# Patient Record
Sex: Female | Born: 1990 | Race: Black or African American | Hispanic: No | Marital: Single | Smoking: Current some day smoker
Health system: Southern US, Community
[De-identification: ages and names within clinical notes are randomized; demographics above are authoritative.]

## PROBLEM LIST (undated history)

## (undated) ENCOUNTER — Inpatient Hospital Stay (HOSPITAL_COMMUNITY): Payer: Self-pay

## (undated) DIAGNOSIS — D649 Anemia, unspecified: Secondary | ICD-10-CM

---
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
## 2009-05-29 ENCOUNTER — Emergency Department (HOSPITAL_COMMUNITY): Admission: EM | Admit: 2009-05-29 | Discharge: 2009-05-29 | Payer: Self-pay | Admitting: Family Medicine

## 2010-07-22 LAB — POCT PREGNANCY, URINE: Preg Test, Ur: NEGATIVE

## 2010-07-22 LAB — GC/CHLAMYDIA PROBE AMP, GENITAL: GC Probe Amp, Genital: POSITIVE — AB

## 2010-07-22 LAB — POCT URINALYSIS DIP (DEVICE)
Bilirubin Urine: NEGATIVE
Glucose, UA: NEGATIVE mg/dL
Nitrite: NEGATIVE
pH: 5.5 (ref 5.0–8.0)

## 2010-07-22 LAB — WET PREP, GENITAL: Yeast Wet Prep HPF POC: NONE SEEN

## 2012-05-30 ENCOUNTER — Emergency Department (HOSPITAL_COMMUNITY): Payer: 59

## 2012-05-30 ENCOUNTER — Emergency Department (HOSPITAL_COMMUNITY)
Admission: EM | Admit: 2012-05-30 | Discharge: 2012-05-30 | Disposition: A | Discharge status: Home / Self Care | Payer: 59 | Attending: Emergency Medicine | Admitting: Emergency Medicine

## 2012-05-30 ENCOUNTER — Encounter (HOSPITAL_COMMUNITY): Payer: Self-pay | Admitting: Emergency Medicine

## 2012-05-30 DIAGNOSIS — X500XXA Overexertion from strenuous movement or load, initial encounter: Secondary | ICD-10-CM | POA: Insufficient documentation

## 2012-05-30 DIAGNOSIS — Y9289 Other specified places as the place of occurrence of the external cause: Secondary | ICD-10-CM | POA: Insufficient documentation

## 2012-05-30 DIAGNOSIS — Y9351 Activity, roller skating (inline) and skateboarding: Secondary | ICD-10-CM | POA: Insufficient documentation

## 2012-05-30 DIAGNOSIS — S82899A Other fracture of unspecified lower leg, initial encounter for closed fracture: Secondary | ICD-10-CM | POA: Insufficient documentation

## 2012-05-30 DIAGNOSIS — R197 Diarrhea, unspecified: Secondary | ICD-10-CM | POA: Insufficient documentation

## 2012-05-30 DIAGNOSIS — R112 Nausea with vomiting, unspecified: Secondary | ICD-10-CM | POA: Insufficient documentation

## 2012-05-30 MED ORDER — HYDROCODONE-ACETAMINOPHEN 5-325 MG PO TABS: 1.0000 | ORAL_TABLET | ORAL | Status: DC | PRN

## 2012-05-30 MED ORDER — PROMETHAZINE HCL 25 MG PO TABS: 25.0000 mg | ORAL_TABLET | Freq: Four times a day (QID) | ORAL | Status: DC | PRN

## 2012-05-30 MED ORDER — IBUPROFEN 800 MG PO TABS
800.0000 mg | ORAL_TABLET | Freq: Once | ORAL | Status: AC
  Administered 2012-05-30: 800 mg via ORAL
  Filled 2012-05-30: qty 1

## 2012-05-30 MED ORDER — ONDANSETRON 4 MG PO TBDP
8.0000 mg | ORAL_TABLET | Freq: Once | ORAL | Status: AC
  Administered 2012-05-30: 8 mg via ORAL
  Filled 2012-05-30: qty 2

## 2012-05-30 MED ORDER — HYDROMORPHONE HCL 2 MG PO TABS: 2.0000 mg | ORAL_TABLET | Freq: Four times a day (QID) | ORAL | Status: DC | PRN

## 2012-05-30 NOTE — ED Notes (Addendum)
Brother Lin Landsman [phone]

## 2012-05-30 NOTE — Progress Notes (Signed)
Orthopedic Tech Progress Note Patient Details:  Cassandra Galloway Dec 14, 1990 [id number]  Ortho Devices Type of Ortho Device: CAM walker Ortho Device/Splint Location: right LE Ortho Device/Splint Interventions: Application   Tay Whitwell T 05/30/2012, 10:03 PM

## 2012-05-30 NOTE — ED Provider Notes (Signed)
History     CSN: [id number]  Arrival date & time 05/30/12  1857   First MD Initiated Contact with Patient 05/30/12 2003      Chief Complaint  Patient presents with  . Ankle Pain    (Consider location/radiation/quality/duration/timing/severity/associated sxs/prior treatment) HPI History provided by pt.   Pt was roller skating last night, skate got caught on the floor, and she inverted her right foot.  C/o severe pain across top of foot and medial/lateral ankle.  Pain aggravated by bearing weight.  Associated w/ tingling on medial aspect of foot.   History reviewed. No pertinent past medical history.  History reviewed. No pertinent past surgical history.  No family history on file.  History  Substance Use Topics  . Smoking status: Never Smoker   . Smokeless tobacco: Not on file  . Alcohol Use: Yes    OB History    Grav Para Term Preterm Abortions TAB SAB Ect Mult Living                  Review of Systems  Gastrointestinal:       N/V/D and diffuse abd discomfort since noon today  All other systems reviewed and are negative.    Allergies  Review of patient's allergies indicates no known allergies.  Home Medications  No current outpatient prescriptions on file.  BP 131/81  Pulse 98  Temp 98 F (36.7 C) (Oral)  Resp 14  SpO2 98%  LMP 05/23/2012  Physical Exam  Nursing note and vitals reviewed. Constitutional: She is oriented to person, place, and time. She appears well-developed and well-nourished. No distress.  HENT:  Head: Normocephalic and atraumatic.  Eyes:       Normal appearance  Neck: Normal range of motion.  Pulmonary/Chest: Effort normal.  Abdominal: Soft. Bowel sounds are normal. She exhibits no distension. There is no tenderness.       obese  Musculoskeletal: Normal range of motion.       Edema of right lateral malleolus.  Tenderness at and inferior to bilateral malleolus as well as dorsal surface of tarsal bones.  Pain w/ passive  dorsiflexion, lateral/medial rotation and foot inversion/eversion. 2+ DP pulse and distal sensation intact.    Neurological: She is alert and oriented to person, place, and time.  Psychiatric: She has a normal mood and affect. Her behavior is normal.    ED Course  Procedures (including critical care time)  Labs Reviewed - No data to display Dg Ankle Complete Right  05/30/2012  *RADIOLOGY REPORT*  Clinical Data: 22 year old female with right ankle injury and pain.  RIGHT ANKLE - COMPLETE 3+ VIEW  Comparison: None  Findings: An oblique fracture the distal fibula is identified with 1 mm posteriolateral displacement. There is no evidence of subluxation or dislocation. Overlying soft tissue swelling is present. No other fractures are identified.  IMPRESSION: Minimally displaced oblique fracture of the distal fibula.  No evidence of subluxation or dislocation.   Original Report Authenticated By: Harmon Pier, M.D.      1. Ankle fracture       MDM  22yo F presents w/ ankle injury.  No deformity and NV intact on exam.  Ibuprofen ordered for pain.  Xray pending.  8:14 PM   Xray shows minimally displaced fx of distal fibula.  Results discussed w/ pt.  Ortho tech placed in cam walker and pt d/c'd home w/ vicodin and referral to ortho.  Also prescribed promethazine.  Pt has had N/V/D and abd  pain since this afternoon.  Afebrile, well-hydrated and abd benign on exam.  No vomiting in ED.  Suspect viral gastroenteritis.  Return precautions discussed.  9:18 PM        Otilio Miu, PA-C 05/30/12 2128

## 2012-05-30 NOTE — ED Notes (Addendum)
PT. FELL YESTERDAY WHILE SKATING , NO LOC , REPORTS RIGHT ANKLE PAIN , ALSO REPORTS INTERMITTENT DIARRHEA /VOMITTING TODAY.

## 2012-06-01 NOTE — ED Provider Notes (Signed)
Medical screening examination/treatment/procedure(s) were performed by non-physician practitioner and as supervising physician I was immediately available for consultation/collaboration.  Alfonzia Woolum, MD 06/01/12 0043 

## 2012-11-29 ENCOUNTER — Emergency Department (HOSPITAL_COMMUNITY)
Admission: EM | Admit: 2012-11-29 | Discharge: 2012-11-29 | Disposition: A | Discharge status: Home / Self Care | Payer: 59 | Attending: Emergency Medicine | Admitting: Emergency Medicine

## 2012-11-29 ENCOUNTER — Encounter (HOSPITAL_COMMUNITY): Payer: Self-pay | Admitting: Cardiology

## 2012-11-29 DIAGNOSIS — R21 Rash and other nonspecific skin eruption: Secondary | ICD-10-CM

## 2012-11-29 DIAGNOSIS — B86 Scabies: Secondary | ICD-10-CM | POA: Insufficient documentation

## 2012-11-29 MED ORDER — PERMETHRIN 5 % EX CREA: TOPICAL_CREAM | CUTANEOUS | Status: DC

## 2012-11-29 MED ORDER — HYDROXYZINE HCL 25 MG PO TABS: 25.0000 mg | ORAL_TABLET | Freq: Four times a day (QID) | ORAL | Status: DC

## 2012-11-29 MED ORDER — PREDNISONE 20 MG PO TABS
60.0000 mg | ORAL_TABLET | Freq: Once | ORAL | Status: AC
  Administered 2012-11-29: 60 mg via ORAL
  Filled 2012-11-29: qty 3

## 2012-11-29 NOTE — ED Provider Notes (Signed)
  CSN: [id number]     Arrival date & time 11/29/12  1256 History     First MD Initiated Contact with Patient 11/29/12 1418     Chief Complaint  Patient presents with  . Rash   (Consider location/radiation/quality/duration/timing/severity/associated sxs/prior Treatment) HPI Comments: 22 year old female presents the emergency department complaining a rash on her arms and her legs beginning 2 weeks ago. Rash is very itchy. Tried applying Epsom salts, rubbing alcohol and multiple other over-the-counter treatments without relief. No new soaps, detergents, pets or medications. Denies any difficulty breathing or swallowing. States that her boyfriend was treated for possible scabies 3 weeks back, however is unsure if that is what exactly was. Boyfriend is no longer with a rash after treatment.  Patient is a 22 y.o. female presenting with rash. The history is provided by the patient.  Rash   History reviewed. No pertinent past medical history. History reviewed. No pertinent past surgical history. History reviewed. No pertinent family history. History  Substance Use Topics  . Smoking status: Never Smoker   . Smokeless tobacco: Not on file  . Alcohol Use: Yes   OB History   Grav Para Term Preterm Abortions TAB SAB Ect Mult Living                 Review of Systems  Skin: Positive for rash.  All other systems reviewed and are negative.    Allergies  Review of patient's allergies indicates no known allergies.  Home Medications   Current Outpatient Rx  Name  Route  Sig  Dispense  Refill  . diphenhydrAMINE (SOMINEX) 25 MG tablet   Oral   Take 50 mg by mouth daily as needed for itching or sleep.         . hydrOXYzine (ATARAX/VISTARIL) 25 MG tablet   Oral   Take 1 tablet (25 mg total) by mouth every 6 (six) hours.   12 tablet   0   . permethrin (ELIMITE) 5 % cream      Apply to affected area once,  Leave on for 10-12 hours and rinse off.   60 g   0    BP 113/67  Pulse 78   Temp(Src) 98.6 F (37 C) (Oral)  Resp 16  SpO2 100% Physical Exam  Nursing note and vitals reviewed. Constitutional: She is oriented to person, place, and time. She appears well-developed and well-nourished. No distress.  HENT:  Head: Normocephalic and atraumatic.  Mouth/Throat: Oropharynx is clear and moist.  No oral lesions.  Eyes: Conjunctivae are normal.  Neck: Normal range of motion. Neck supple.  Cardiovascular: Normal rate, regular rhythm and normal heart sounds.   Pulmonary/Chest: Effort normal and breath sounds normal.  Musculoskeletal: Normal range of motion. She exhibits no edema.  Neurological: She is alert and oriented to person, place, and time.  Skin: Skin is warm and dry. She is not diaphoretic.  Scattered maculopapular raised erythematous rash with excoriations, bilateral arms/leg/ankles, worse posterior thighs.  Psychiatric: She has a normal mood and affect. Her behavior is normal.    ED Course   Procedures (including critical care time)  Labs Reviewed - No data to display No results found. 1. Rash   2. Scabies     MDM  Patient with rash, consistent with scabies, pruritic. Rx permethrin cream, atarax. Prednisone 60mg  PO given in ED. Infection care/precautions discussed.  Trevor Mace, PA-C 11/29/12 1433  Trevor Mace, PA-C 11/29/12 1445

## 2012-11-29 NOTE — ED Provider Notes (Signed)
  Medical screening examination/treatment/procedure(s) were performed by non-physician practitioner and as supervising physician I was immediately available for consultation/collaboration.    Kiptyn Rafuse, MD 11/29/12 1656 

## 2012-11-29 NOTE — ED Notes (Signed)
Pt reports a rash that started a couple of weeks ago. Reports that she boyfriend was recently treated for possible scabies.

## 2012-12-05 ENCOUNTER — Encounter (HOSPITAL_COMMUNITY): Payer: Self-pay | Admitting: Emergency Medicine

## 2012-12-05 ENCOUNTER — Emergency Department (HOSPITAL_COMMUNITY)
Admission: EM | Admit: 2012-12-05 | Discharge: 2012-12-05 | Disposition: A | Discharge status: Home / Self Care | Payer: 59 | Source: Home / Self Care | Attending: Emergency Medicine | Admitting: Emergency Medicine

## 2012-12-05 DIAGNOSIS — B86 Scabies: Secondary | ICD-10-CM

## 2012-12-05 MED ORDER — METHYLPREDNISOLONE ACETATE 80 MG/ML IJ SUSP
INTRAMUSCULAR | Status: AC
  Filled 2012-12-05: qty 1

## 2012-12-05 MED ORDER — IVERMECTIN 3 MG PO TABS: ORAL_TABLET | ORAL | Status: DC

## 2012-12-05 MED ORDER — TRIAMCINOLONE ACETONIDE 0.1 % EX CREA: TOPICAL_CREAM | Freq: Three times a day (TID) | CUTANEOUS | Status: DC

## 2012-12-05 MED ORDER — METHYLPREDNISOLONE ACETATE 80 MG/ML IJ SUSP
80.0000 mg | Freq: Once | INTRAMUSCULAR | Status: AC
  Administered 2012-12-05: 80 mg via INTRAMUSCULAR

## 2012-12-05 MED ORDER — HYDROXYZINE HCL 25 MG PO TABS: 25.0000 mg | ORAL_TABLET | Freq: Four times a day (QID) | ORAL | Status: DC

## 2012-12-05 MED ORDER — PREDNISONE 10 MG PO TABS: ORAL_TABLET | ORAL | Status: DC

## 2012-12-05 NOTE — ED Notes (Signed)
C/o allergic reaction.  Patient states she used a one time medication for scabies on Tuesday, then on Wednesday she notices a rash on long her body.  Rash does itch

## 2012-12-05 NOTE — ED Provider Notes (Signed)
Chief Complaint:   Chief Complaint  Patient presents with  . Allergic Reaction    History of Present Illness:   Cassandra Galloway is a 22 year old female who has a very pruritic rash on her arms, trunk, back, and proximal thighs. This is been going on about 2-3 weeks. She's been exposed to boyfriend who has been diagnosed with scabies. He's been treated and is rash and itching are better. She went to the emergency room this past Tuesday which was 6 days ago. She was diagnosed with scabies and given permethrin cream. She apply the cream as instructed, but her rash is worse with worsened itching. She denies any difficulty breathing or swelling of the lips, tongue, or throat.  Review of Systems:  Other than noted above, the patient denies any of the following symptoms: Systemic:  No fever, chills, sweats, weight loss, or fatigue. ENT:  No nasal congestion, rhinorrhea, sore throat, swelling of lips, tongue or throat. Resp:  No cough, wheezing, or shortness of breath. Skin:  No rash, itching, nodules, or suspicious lesions.  PMFSH:  Past medical history, family history, social history, meds, and allergies were reviewed.   Physical Exam:   Vital signs:  BP 125/75  Pulse 84  Temp(Src) 97.7 F (36.5 C) (Oral)  Resp 18  SpO2 91%  LMP 11/13/2012 Gen:  Alert, oriented, in no distress. ENT:  Pharynx clear, no intraoral lesions, moist mucous membranes. Lungs:  Clear to auscultation. Skin:  There are scattered erythematous maculopapules on the trunk, under the breasts, on the back, proximal extremities, and thighs. She has a few on the right wrist, and none in the size of the fingers or in the web spaces.  Course in Urgent Care Center:   Given Depo-Medrol 80 mg IM.  Assessment:  The encounter diagnosis was Scabies.  Several possibilities exist: First of all, she could have had scabies, the permethrin at work, but she still has itching due to persistence of the dead scabies under the skin, second she  could have had scabies, the permethrin work, but she had an allergic reaction to permethrin, third is possible of the permethrin didn't do any good and she still has scabies, for this could be something else entirely. I'm going to treat with ivermectin pills as well as prednisone by mouth, injection, and cream. If this fails to help, she'll need to see a dermatologist.  Plan:   1.  The following meds were prescribed:   Discharge Medication List as of 12/05/2012  5:29 PM    START taking these medications   Details  !! hydrOXYzine (ATARAX/VISTARIL) 25 MG tablet Take 1 tablet (25 mg total) by mouth every 6 (six) hours., Starting 12/05/2012, Until Discontinued, Normal    ivermectin (STROMECTOL) 3 MG TABS Take 6 tabs by mouth once., Normal    predniSONE (DELTASONE) 10 MG tablet Take 4 tabs daily for 4 days, 3 tabs daily for 4 days, 2 tabs daily for 4 days, then 1 tab daily for 4 days., Normal    triamcinolone cream (KENALOG) 0.1 % Apply topically 3 (three) times daily., Starting 12/05/2012, Until Discontinued, Normal     !! - Potential duplicate medications found. Please discuss with provider.     2.  The patient was instructed in symptomatic care and handouts were given. 3.  The patient was told to return if becoming worse in any way, if no better in 3 or 4 days, and given some red flag symptoms such as worsening rash that would indicate earlier  return. 4.  Follow up with Dr. Para Skeans if no better in a week.     Reuben Likes, MD 12/05/12 2052

## 2013-08-26 ENCOUNTER — Encounter (HOSPITAL_COMMUNITY): Payer: Self-pay | Admitting: Emergency Medicine

## 2013-08-26 ENCOUNTER — Emergency Department (INDEPENDENT_AMBULATORY_CARE_PROVIDER_SITE_OTHER)
Admission: EM | Admit: 2013-08-26 | Discharge: 2013-08-26 | Disposition: A | Discharge status: Home / Self Care | Payer: Self-pay | Source: Home / Self Care | Attending: Emergency Medicine | Admitting: Emergency Medicine

## 2013-08-26 ENCOUNTER — Other Ambulatory Visit (HOSPITAL_COMMUNITY)
Admission: RE | Admit: 2013-08-26 | Discharge: 2013-08-26 | Disposition: A | Discharge status: Home / Self Care | Payer: Self-pay | Source: Ambulatory Visit | Attending: Emergency Medicine | Admitting: Emergency Medicine

## 2013-08-26 DIAGNOSIS — N898 Other specified noninflammatory disorders of vagina: Secondary | ICD-10-CM

## 2013-08-26 DIAGNOSIS — N760 Acute vaginitis: Secondary | ICD-10-CM | POA: Insufficient documentation

## 2013-08-26 DIAGNOSIS — Z113 Encounter for screening for infections with a predominantly sexual mode of transmission: Secondary | ICD-10-CM | POA: Insufficient documentation

## 2013-08-26 DIAGNOSIS — Z202 Contact with and (suspected) exposure to infections with a predominantly sexual mode of transmission: Secondary | ICD-10-CM

## 2013-08-26 DIAGNOSIS — N390 Urinary tract infection, site not specified: Secondary | ICD-10-CM

## 2013-08-26 LAB — POCT URINALYSIS DIP (DEVICE)
BILIRUBIN URINE: NEGATIVE
Glucose, UA: NEGATIVE mg/dL
Hgb urine dipstick: NEGATIVE
Ketones, ur: NEGATIVE mg/dL
Nitrite: NEGATIVE
PH: 7 (ref 5.0–8.0)
PROTEIN: NEGATIVE mg/dL
SPECIFIC GRAVITY, URINE: 1.025 (ref 1.005–1.030)
Urobilinogen, UA: 1 mg/dL (ref 0.0–1.0)

## 2013-08-26 LAB — POCT PREGNANCY, URINE: PREG TEST UR: NEGATIVE

## 2013-08-26 MED ORDER — CEFTRIAXONE SODIUM 250 MG IJ SOLR
INTRAMUSCULAR | Status: AC
  Filled 2013-08-26: qty 250

## 2013-08-26 MED ORDER — NITROFURANTOIN MONOHYD MACRO 100 MG PO CAPS: 100.0000 mg | ORAL_CAPSULE | Freq: Two times a day (BID) | ORAL | Status: DC

## 2013-08-26 MED ORDER — CEFTRIAXONE SODIUM 250 MG IJ SOLR
250.0000 mg | Freq: Once | INTRAMUSCULAR | Status: AC
  Administered 2013-08-26: 250 mg via INTRAMUSCULAR

## 2013-08-26 MED ORDER — AZITHROMYCIN 250 MG PO TABS
1000.0000 mg | ORAL_TABLET | Freq: Once | ORAL | Status: AC
  Administered 2013-08-26: 1000 mg via ORAL

## 2013-08-26 MED ORDER — AZITHROMYCIN 250 MG PO TABS
ORAL_TABLET | ORAL | Status: AC
  Filled 2013-08-26: qty 4

## 2013-08-26 MED ORDER — LIDOCAINE HCL (PF) 1 % IJ SOLN
INTRAMUSCULAR | Status: AC
  Filled 2013-08-26: qty 5

## 2013-08-26 NOTE — ED Provider Notes (Signed)
CSN: [id number]     Arrival date & time 08/26/13  1638 History   First MD Initiated Contact with Patient 08/26/13 1847     Chief Complaint  Patient presents with  . Vaginal Discharge    concerns for stds  . Dysuria    Patient is a 23 y.o. female presenting with vaginal discharge and dysuria. The history is provided by the patient.  Vaginal Discharge Quality:  Yellow and green Severity:  Moderate Onset quality:  Gradual Duration:  1 week Timing:  Constant Progression:  Worsening Chronicity:  New Relieved by:  None tried Ineffective treatments:  OTC medications Associated symptoms: abdominal pain and dysuria   Abdominal pain:    Location:  Suprapubic   Quality:  Cramping   Severity:  Mild   Onset quality:  Sudden   Duration:  1 day   Timing:  Intermittent   Progression:  Unchanged   Chronicity:  New Risk factors: STI and unprotected sex   Risk factors: no endometriosis, no foreign body, no gynecological surgery, no immunosuppression, no new sexual partner, no PID, no prior miscarriage, no STI exposure and no terminated pregnancy   Dysuria Pain quality:  Burning Pain severity:  Mild Onset quality:  Gradual Duration:  1 day Timing:  Constant Progression:  Unchanged Chronicity:  New Recent urinary tract infections: yes   Relieved by:  None tried Worsened by:  Nothing tried Associated symptoms: abdominal pain and vaginal discharge   Risk factors: sexually active and sexually transmitted infections   Risk factors: no hx of pyelonephritis, no hx of urolithiasis, no kidney transplant, not pregnant, no recurrent urinary tract infections, no renal cysts, no renal disease, not single kidney and no urinary catheter   Pt is G0, on Mirena IUD for contraception w/ h/o Trichomonas. Admits to being sexually active w/ one partner (occasional use of condoms). Last PAP and annual screening 10/2012.  History reviewed. No pertinent past medical history. History reviewed. No pertinent past  surgical history. History reviewed. No pertinent family history. History  Substance Use Topics  . Smoking status: Never Smoker   . Smokeless tobacco: Not on file  . Alcohol Use: Yes   OB History   Grav Para Term Preterm Abortions TAB SAB Ect Mult Living                 Review of Systems  Gastrointestinal: Positive for abdominal pain.  Genitourinary: Positive for dysuria and vaginal discharge.  All other systems reviewed and are negative.   Allergies  Review of patient's allergies indicates no known allergies.  Home Medications   Prior to Admission medications   Medication Sig Start Date End Date Taking? Authorizing Provider  diphenhydrAMINE (SOMINEX) 25 MG tablet Take 50 mg by mouth daily as needed for itching or sleep.    Historical Provider, MD  hydrOXYzine (ATARAX/VISTARIL) 25 MG tablet Take 1 tablet (25 mg total) by mouth every 6 (six) hours. 11/29/12   Trevor Maceobyn M Albert, PA-C  hydrOXYzine (ATARAX/VISTARIL) 25 MG tablet Take 1 tablet (25 mg total) by mouth every 6 (six) hours. 12/05/12   Reuben Likesavid C Keller, MD  ivermectin (STROMECTOL) 3 MG TABS Take 6 tabs by mouth once. 12/05/12   Reuben Likesavid C Keller, MD  permethrin (ELIMITE) 5 % cream Apply to affected area once,  Leave on for 10-12 hours and rinse off. 11/29/12   Trevor Maceobyn M Albert, PA-C  predniSONE (DELTASONE) 10 MG tablet Take 4 tabs daily for 4 days, 3 tabs daily for 4 days, 2 tabs  daily for 4 days, then 1 tab daily for 4 days. 12/05/12   Reuben Likesavid C Keller, MD  triamcinolone cream (KENALOG) 0.1 % Apply topically 3 (three) times daily. 12/05/12   Reuben Likesavid C Keller, MD   BP 130/85  Pulse 80  Temp(Src) 99.3 F (37.4 C) (Oral)  Resp 20  SpO2 100% Physical Exam  Constitutional: She is oriented to person, place, and time. She appears well-developed and well-nourished.  HENT:  Head: Normocephalic and atraumatic.  Eyes: Conjunctivae are normal.  Pulmonary/Chest: Effort normal.  Abdominal: Soft. She exhibits no distension and no mass. There is no  tenderness. There is no rebound and no guarding. Hernia confirmed negative in the right inguinal area and confirmed negative in the left inguinal area.  Genitourinary: Uterus normal. Pelvic exam was performed with patient supine. No labial fusion. There is no rash, tenderness, lesion or injury on the right labia. There is no rash, tenderness, lesion or injury on the left labia. Cervix exhibits motion tenderness, discharge and friability. Right adnexum displays no mass, no tenderness and no fullness. Left adnexum displays no mass, no tenderness and no fullness. No erythema, tenderness or bleeding around the vagina. No foreign body around the vagina. No signs of injury around the vagina. Vaginal discharge found.  Copius thick,  light greenish-yellow d/c   Lymphadenopathy:       Right: No inguinal adenopathy present.       Left: No inguinal adenopathy present.  Neurological: She is alert and oriented to person, place, and time.  Skin: Skin is warm and dry.  Psychiatric: She has a normal mood and affect.    ED Course  Pelvic exam Date/Time: 08/26/2013 7:42 PM Performed by: Leanne ChangSCHORR, Nevea Spiewak P Authorized by: Leslee HomeKELLER, DAVID C Consent: Verbal consent obtained. Risks and benefits: risks, benefits and alternatives were discussed Consent given by: patient Patient understanding: patient states understanding of the procedure being performed Required items: required blood products, implants, devices, and special equipment available Patient identity confirmed: verbally with patient and arm band Preparation: Patient was prepped and draped in the usual sterile fashion. Local anesthesia used: no Patient sedated: no Patient tolerance: Patient tolerated the procedure well with no immediate complications.   (including critical care time) Labs Review Labs Reviewed  POCT URINALYSIS DIP (DEVICE) - Abnormal; Notable for the following:    Leukocytes, UA LARGE (*)    All other components within normal limits   HIV ANTIBODY (ROUTINE TESTING)  POCT PREGNANCY, URINE  CERVICOVAGINAL ANCILLARY ONLY    Imaging Review No results found.   MDM   1. Vaginal discharge   2. Possible exposure to STD   3. UTI (urinary tract infection)    Vag d/c x 1 week w/ dysuria x 1 day associated w/ mild intermittent lower abd cramping. No fever. Pelvic remarkable for copius greenish-yellow, thick vag d/c w/ + CMT and Friability. U/A +. Will treat w/ Rocephin and Zithromax today as well as Macrobid x 7 days for symptomatic + u/a. Pt agreeable to HIV screening. Will call Monday for results of remaining tests. [facility] screening and assessment and management of Mirena IUD. Safe sex and STD precautions discussed and provided in print. Pt verbalizes understanding and is agreeable w/ plan.    Leanne ChangKatherine P Risa Auman, NP 08/26/13 2038

## 2013-08-26 NOTE — ED Notes (Signed)
C/o  Dysuria.  Hematuria.  Thick yellow vaginal discharge.  Mild odor.  Symptoms from 4/14 No otc treatments tried

## 2013-08-26 NOTE — Discharge Instructions (Signed)
Call Monday to get your culture results. If Bacterial Vaginosis, Yeast or Trichomonas return positive we will call your treatment into your pharmacy. Do not have intercourse for 10 days. When you resume intercourse use condoms. Take the antibiotic as directed for your urinary tract infection and drink plenty of liquids, preferably water. Arrange follow up at the [facility] or [facility] for your annual screening and assessment & management of your Mirena IUD.   Urinary Tract Infection Urinary tract infections (UTIs) can develop anywhere along your urinary tract. Your urinary tract is your body's drainage system for removing wastes and extra water. Your urinary tract includes two kidneys, two ureters, a bladder, and a urethra. Your kidneys are a pair of bean-shaped organs. Each kidney is about the size of your fist. They are located below your ribs, one on each side of your spine. CAUSES Infections are caused by microbes, which are microscopic organisms, including fungi, viruses, and bacteria. These organisms are so small that they can only be seen through a microscope. Bacteria are the microbes that most commonly cause UTIs. SYMPTOMS  Symptoms of UTIs may vary by age and gender of the patient and by the location of the infection. Symptoms in young women typically include a frequent and intense urge to urinate and a painful, burning feeling in the bladder or urethra during urination. Older women and men are more likely to be tired, shaky, and weak and have muscle aches and abdominal pain. A fever may mean the infection is in your kidneys. Other symptoms of a kidney infection include pain in your back or sides below the ribs, nausea, and vomiting. DIAGNOSIS To diagnose a UTI, your caregiver will ask you about your symptoms. Your caregiver also will ask to provide a urine sample. The urine sample will be tested for bacteria and white blood cells. White blood cells are made by  your body to help fight infection. TREATMENT  Typically, UTIs can be treated with medication. Because most UTIs are caused by a bacterial infection, they usually can be treated with the use of antibiotics. The choice of antibiotic and length of treatment depend on your symptoms and the type of bacteria causing your infection. HOME CARE INSTRUCTIONS  If you were prescribed antibiotics, take them exactly as your caregiver instructs you. Finish the medication even if you feel better after you have only taken some of the medication.  Drink enough water and fluids to keep your urine clear or pale yellow.  Avoid caffeine, tea, and carbonated beverages. They tend to irritate your bladder.  Empty your bladder often. Avoid holding urine for long periods of time.  Empty your bladder before and after sexual intercourse.  After a bowel movement, women should cleanse from front to back. Use each tissue only once. SEEK MEDICAL CARE IF:   You have back pain.  You develop a fever.  Your symptoms do not begin to resolve within 3 days. SEEK IMMEDIATE MEDICAL CARE IF:   You have severe back pain or lower abdominal pain.  You develop chills.  You have nausea or vomiting.  You have continued burning or discomfort with urination. MAKE SURE YOU:   Understand these instructions.  Will watch your condition.  Will get help right away if you are not doing well or get worse. Document Released: 01/29/2005 Document Revised: 10/21/2011 Document Reviewed: 05/30/2011 [facility] Patient Information 2014 [place], [place].  Sexually Transmitted Disease A sexually transmitted disease (STD) is a disease or infection. It  may be passed from person to person. It usually is passed during sex. STDs can be spread by different types of germs. These germs are bacteria, viruses, and parasites. An STD can be passed through:  Spit (saliva).  Semen.  Blood.  Mucus from the vagina.  Pee (urine). HOW CAN I LESSEN MY  CHANCES OF GETTING AN STD?  Only use condoms labeled "latex," dental dams, and lubricants that wash away with water (water soluble). Do not use petroleum jelly or oils.  Get shots (vaccines) for HPV and hepatitis.  Avoid risky sex behavior that can break the skin. WHAT SHOULD I DO IF I THINK I HAVE AN STD?  See your doctor.  Tell your sex partner(s) that you have an STD. They should be tested and treated.  Do not have sex until your doctor says it is OK. WHEN SHOULD I GET HELP? Get help if:  You have bad belly (abdominal) pain.  You are a man and have puffiness (swelling) or pain in your testicles.  You are a woman and have puffiness in your vagina. MAKE SURE YOU:  Understand these instructions. Document Released: 05/29/2004 Document Revised: 02/09/2013 Document Reviewed: 10/15/2012 [facility] Patient Information 2014 [place], [place].

## 2013-08-26 NOTE — ED Notes (Signed)
Pt given injection will discharge at 8:15 p.m.   Mw,cma

## 2013-08-27 LAB — HIV ANTIBODY (ROUTINE TESTING W REFLEX): HIV 1&2 Ab, 4th Generation: NONREACTIVE

## 2013-08-27 NOTE — ED Provider Notes (Signed)
Medical screening examination/treatment/procedure(s) were performed by non-physician practitioner and as supervising physician I was immediately available for consultation/collaboration.  Leslee Homeavid Belal Scallon, M.D.  Reuben Likesavid C Melisia Leming, MD 08/27/13 1440

## 2013-08-29 LAB — CERVICOVAGINAL ANCILLARY ONLY
Chlamydia: NEGATIVE
Neisseria Gonorrhea: NEGATIVE
Wet Prep (BD Affirm): NEGATIVE
Wet Prep (BD Affirm): NEGATIVE
Wet Prep (BD Affirm): POSITIVE — AB

## 2013-08-31 ENCOUNTER — Telehealth (HOSPITAL_COMMUNITY): Payer: Self-pay | Admitting: Emergency Medicine

## 2013-08-31 ENCOUNTER — Telehealth (HOSPITAL_COMMUNITY): Payer: Self-pay | Admitting: *Deleted

## 2013-08-31 MED ORDER — METRONIDAZOLE 500 MG PO TABS: ORAL_TABLET | ORAL | Status: DC

## 2013-08-31 NOTE — ED Notes (Signed)
I called pt. Pt. verified x 2 and given results.  Pt. told she needs Flagyl for Trich. Pt. states she already picked up the Rx.   Pt. instructed to no alcohol while taking this medication.  Pt. instructed to notify her partner to be treated with Flagyl, no sex until she has finished her medication and her partner has been treated and to practice safe sex. You can get HIV rechecked in 6 mos. at [facility], by appointment. Pt. states her partner came the same day and was tested. I told her to have him call me and I would check his results and as the doctor about treatment. Desiree LucySuzanne M [facility] 08/31/2013

## 2013-08-31 NOTE — Telephone Encounter (Signed)
Message copied by Reuben LikesKELLER, Michel Hendon C on Wed Aug 31, 2013  1:57 PM ------      Message from: Vassie MoselleYORK, SUZANNE M      Created: Tue Aug 30, 2013 11:21 PM      Regarding: labs       Trich pos. Needs treatment.      Desiree LucySuzanne M [place]      08/30/2013       ------

## 2013-08-31 NOTE — ED Notes (Signed)
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  She will need Flagyl 500 mg #4 all at one time.  Will send to her pharmacy, [facility] on [address].  We will call her and inform her of this result and need to inform her sexual partners of their need to get treated as well.  Reuben Likesavid C Holdan Stucke, MD 08/31/13 [phone]

## 2013-09-02 NOTE — ED Notes (Signed)
Pt. called on VM with questions.  I called pt. back.  She said she took her pills for Trich @ 2230 on Wed. and is still having a slight light yellow discharge this AM.  She said she only got the 4 pills and her boyfriend got 2 BID # 14. I explained it was just a difference in the providers orders. She got adequate treatment according to the CDC guidelines.  Discussed with Dr. Lorenz CoasterKeller and he agreed and said she may have had some bacterial vaginosis. I checked her lab results and she only had the Trich.  I told her it may not have been enough time to clear the infection. I told her if symptoms persist by next Wednesday to come back and get rechecked. Pt. agreed with this plan. Cassandra Galloway 09/02/2013

## 2014-06-19 IMAGING — CR DG ANKLE COMPLETE 3+V*R*
3 series · 3 of 3 positions shown · non-contrast
Comparison: None

CLINICAL DATA: 22 year old female with right ankle injury and pain.

RIGHT ANKLE - COMPLETE 3+ VIEW

[x ankle ap right]
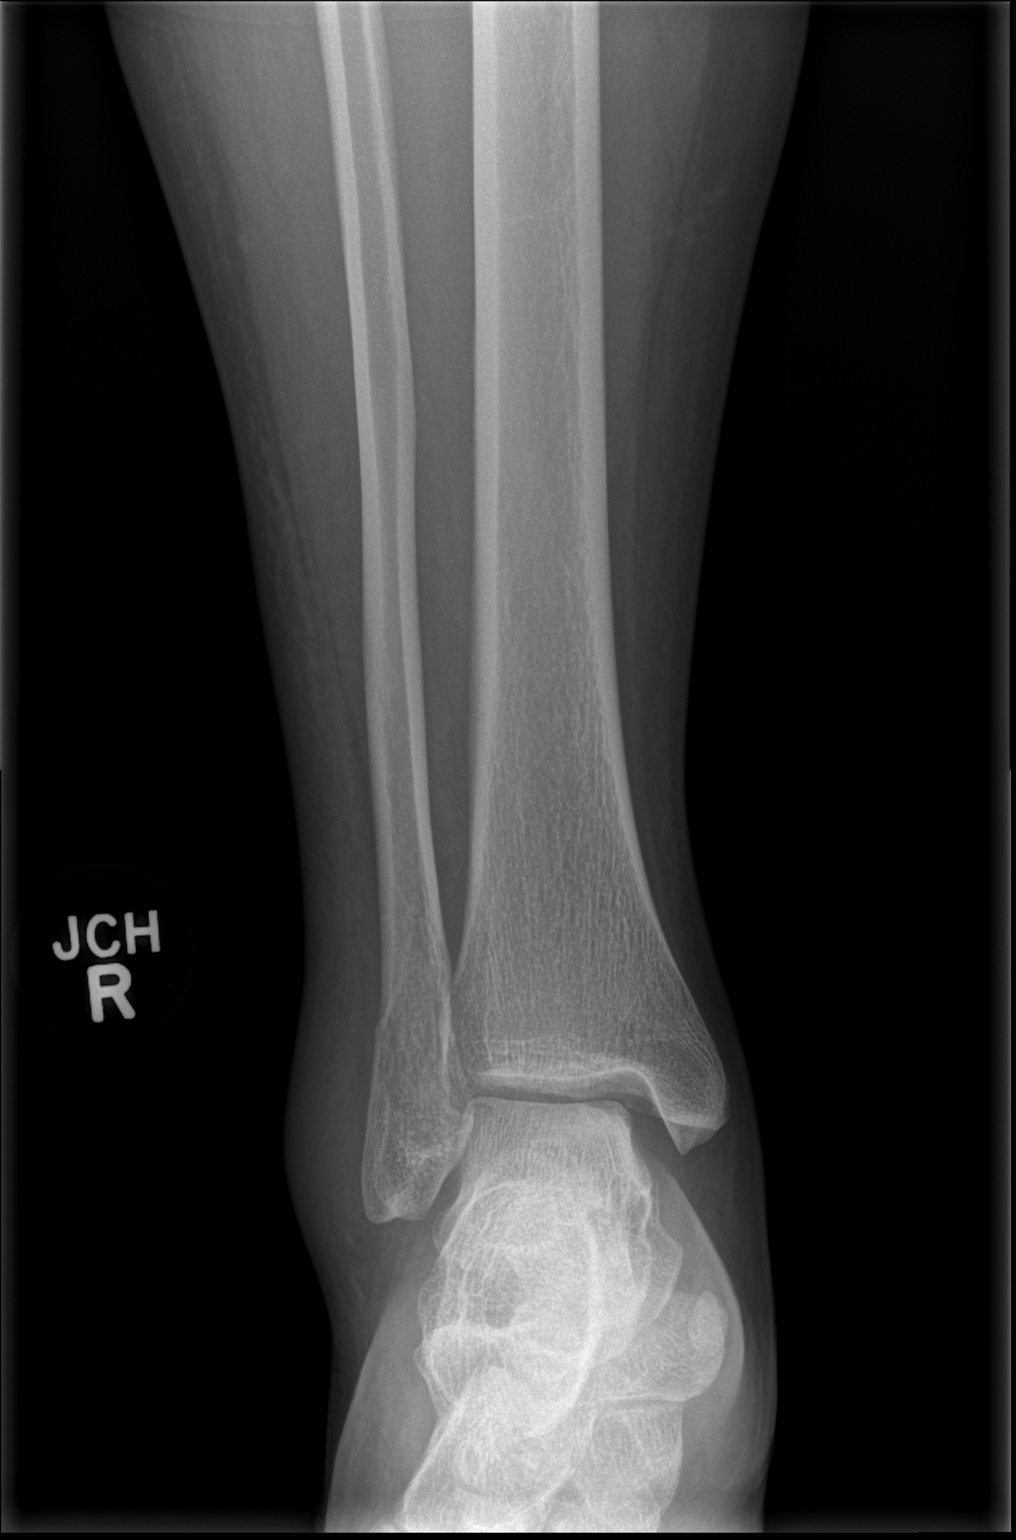

[x ankle obl right]
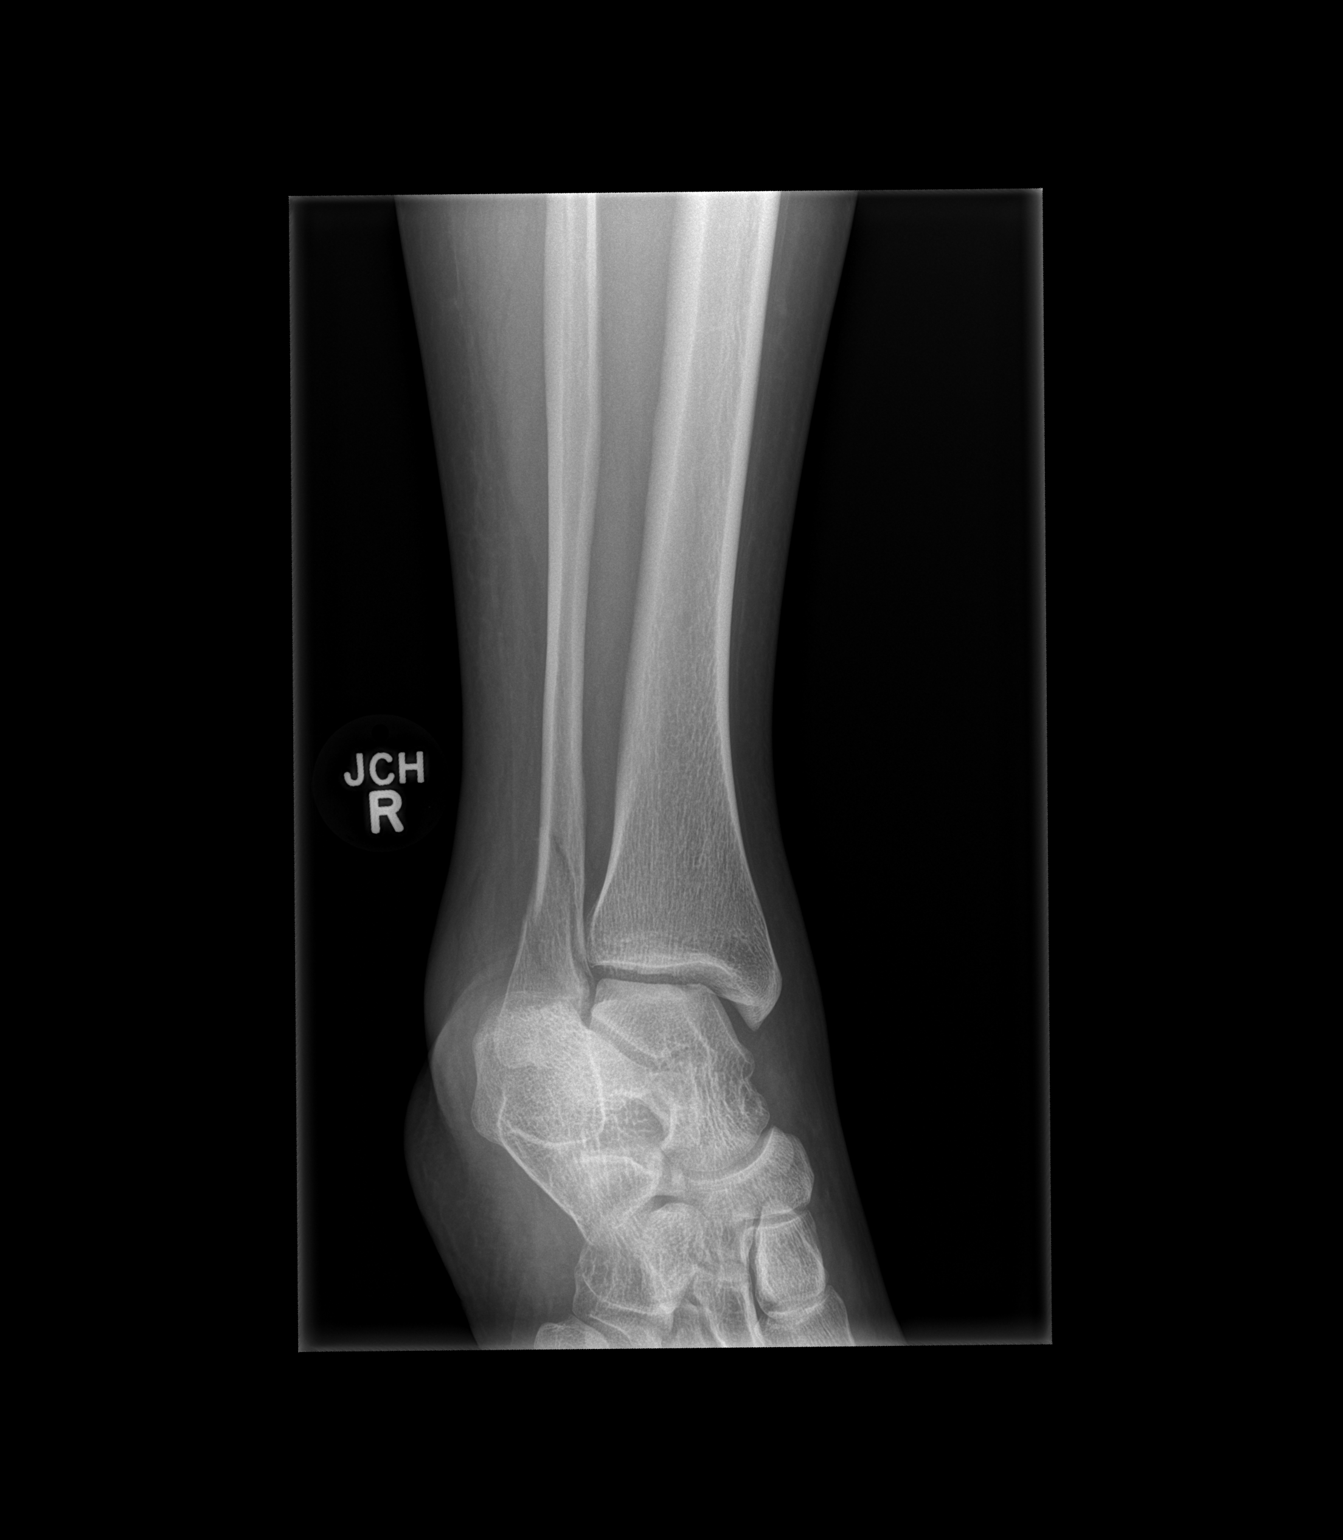

[x ankle lat right]
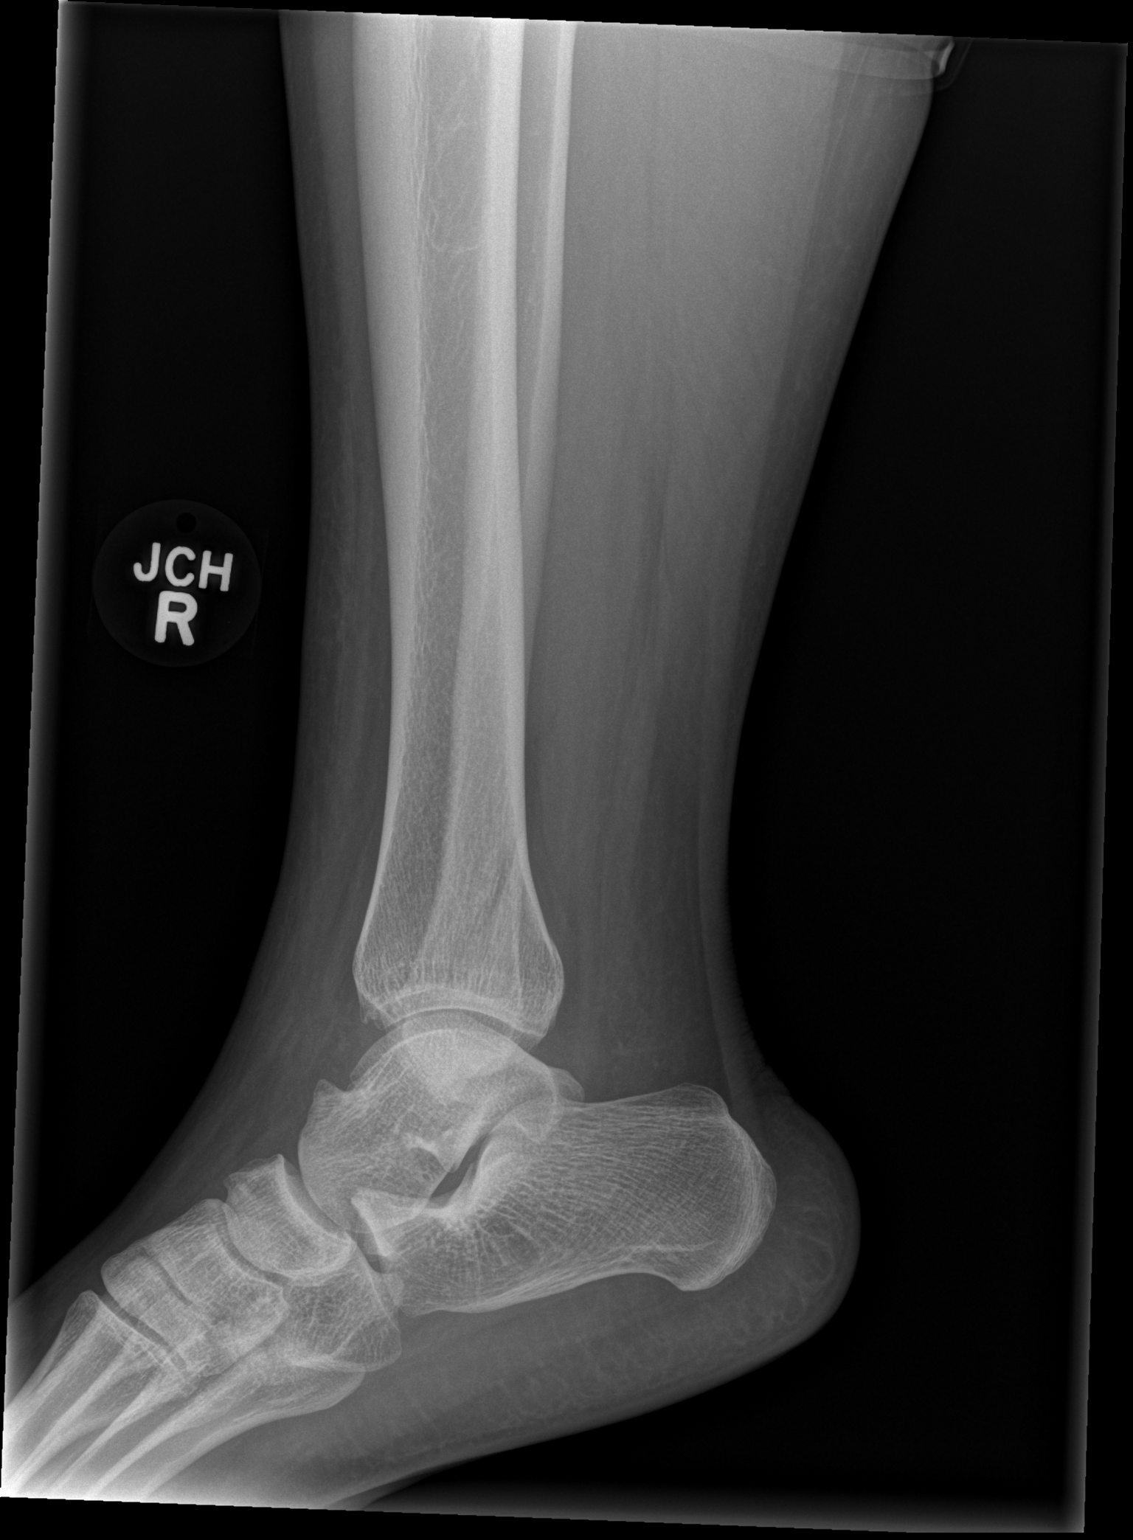

[3 of 3 positions shown; findings below may reference images not displayed]

FINDINGS: An oblique fracture the distal fibula is identified with
1 mm posteriolateral displacement.
There is no evidence of subluxation or dislocation.
Overlying soft tissue swelling is present.
No other fractures are identified.
IMPRESSION: Minimally displaced oblique fracture of the distal fibula.  No
evidence of subluxation or dislocation.

## 2017-11-19 ENCOUNTER — Encounter (HOSPITAL_COMMUNITY): Payer: Self-pay

## 2017-11-19 ENCOUNTER — Ambulatory Visit (HOSPITAL_COMMUNITY)
Admission: EM | Admit: 2017-11-19 | Discharge: 2017-11-19 | Disposition: A | Discharge status: Home / Self Care | Payer: Self-pay | Attending: Family | Admitting: Family

## 2017-11-19 DIAGNOSIS — N898 Other specified noninflammatory disorders of vagina: Secondary | ICD-10-CM

## 2017-11-19 DIAGNOSIS — N9089 Other specified noninflammatory disorders of vulva and perineum: Secondary | ICD-10-CM

## 2017-11-19 DIAGNOSIS — N9489 Other specified conditions associated with female genital organs and menstrual cycle: Secondary | ICD-10-CM

## 2017-11-19 MED ORDER — FLUCONAZOLE 200 MG PO TABS: 200.0000 mg | ORAL_TABLET | Freq: Every day | ORAL | 0 refills | Status: AC

## 2017-11-19 MED ORDER — METHYLPREDNISOLONE ACETATE 80 MG/ML IJ SUSP
80.0000 mg | Freq: Once | INTRAMUSCULAR | Status: AC
  Administered 2017-11-19: 80 mg via INTRAMUSCULAR

## 2017-11-19 MED ORDER — METHYLPREDNISOLONE ACETATE 80 MG/ML IJ SUSP
INTRAMUSCULAR | Status: AC
  Filled 2017-11-19: qty 1

## 2017-11-19 NOTE — ED Notes (Signed)
At bedside with patient and provider during exam

## 2017-11-19 NOTE — Discharge Instructions (Signed)
You were given a steroid shot today to help with irritation, itching and swelling. Recommend start Diflucan 200mg  once daily for 5 days. Recommend cool compresses to area for comfort. Do not apply any topical medication. Just wash with water. Follow-up in 2 to 3 days if not improving.

## 2017-11-19 NOTE — ED Triage Notes (Signed)
Pt Presents with extreme vaginal irritation and possibly allergic reaction to vaginal cream

## 2017-11-20 NOTE — ED Provider Notes (Signed)
[facility]    CSN: [id number] Arrival date & time: 11/19/17  1131     History   Chief Complaint Chief Complaint  Patient presents with  . Vaginal Irritation    HPI Cassandra Galloway is a 27 y.o. female.   27 year old female presents with vaginal irritation and labial swelling. She first experienced some vaginal itching and slightly clumpy white discharge 3 days ago. Last night she tried to treat the probable yeast infection with OTC Tioconazole suppository (one dose). During the night she started experiencing vaginal irritation and pain as well as swelling. She was unable to sleep due to the discomfort. She is concerned she may be having an allergic reaction to the OTC anti-fungal. She took oral Benadryl 2 hours ago which has helped some with the itching. She also has applied an ice pack to the area. She denies any fever, abdominal, pelvic or back pain. She also denies any distinct dysuria, unusual vaginal bleeding, nausea, vomiting or diarrhea. She has a history of recurrent BV in which her GYN has her on a monthly routine of taking 4 tablets of Flagyl for one day after her period ends. She also takes 1 dose of Diflucan due to the Flagyl usually giving her a yeast infection. Her last dose was about 3 weeks ago. Her last period was 10/26/17 and is due in about 1 week. She has not been recently sexually active. Otherwise no chronic health issues. Takes no daily medication.   The history is provided by the patient.    History reviewed. No pertinent past medical history.  There are no active problems to display for this patient.   History reviewed. No pertinent surgical history.  OB History   None      Home Medications    Prior to Admission medications   Medication Sig Start Date End Date Taking? Authorizing Provider  diphenhydrAMINE (SOMINEX) 25 MG tablet Take 50 mg by mouth daily as needed for itching or sleep.    [provider]  fluconazole  (DIFLUCAN) 200 MG tablet Take 1 tablet (200 mg total) by mouth daily for 5 days. 11/19/17 11/24/17  Sudie Grumbling, NP  metroNIDAZOLE (FLAGYL) 500 MG tablet Take all 4 tablets at 1 time. 08/31/13   Reuben Likes, MD    Family History History reviewed. No pertinent family history.  Social History Social History   Tobacco Use  . Smoking status: Never Smoker  Substance Use Topics  . Alcohol use: Yes  . Drug use: No     Allergies   Patient has no known allergies.   Review of Systems Review of Systems  Constitutional: Negative for activity change, appetite change, chills, fatigue and fever.  HENT: Negative for mouth sores, sore throat and trouble swallowing.   Respiratory: Negative for cough, chest tightness, shortness of breath and wheezing.   Cardiovascular: Negative for chest pain and palpitations.  Gastrointestinal: Negative for abdominal pain, diarrhea, nausea and vomiting.  Genitourinary: Positive for vaginal discharge and vaginal pain. Negative for decreased urine volume, difficulty urinating, dysuria, flank pain, frequency, genital sores, hematuria, pelvic pain, urgency and vaginal bleeding.  Musculoskeletal: Negative for arthralgias, back pain and myalgias.  Skin: Positive for rash. Negative for color change and wound.  Allergic/Immunologic: Negative for immunocompromised state.  Neurological: Negative for dizziness, seizures, syncope, weakness, light-headedness, numbness and headaches.  Hematological: Negative for adenopathy. Does not bruise/bleed easily.  Psychiatric/Behavioral: Negative.      Physical Exam Triage Vital Signs ED  Triage Vitals [11/19/17 1205]  Enc Vitals Group     BP 112/66     Pulse Rate 72     Resp 19     Temp 98.9 F (37.2 C)     Temp Source Temporal     SpO2 96 %     Weight      Height      Head Circumference      Peak Flow      Pain Score      Pain Loc      Pain Edu?      Excl. in GC?    No data found.  Updated Vital Signs BP  112/66 (BP Location: Left Arm)   Pulse 72   Temp 98.9 F (37.2 C) (Temporal)   Resp 19   LMP 10/26/2017   SpO2 96%   Visual Acuity Right Eye Distance:   Left Eye Distance:   Bilateral Distance:    Right Eye Near:   Left Eye Near:    Bilateral Near:     Physical Exam  Constitutional: She is oriented to person, place, and time. Vital signs are normal. She appears well-developed and well-nourished. She is cooperative. She does not appear ill. No distress.  She is sitting on the exam table in no acute distress but appears uncomfortable sitting.   HENT:  Head: Normocephalic and atraumatic.  Mouth/Throat: Oropharynx is clear and moist.  Eyes: Conjunctivae and EOM are normal.  Neck: Normal range of motion. Neck supple.  Cardiovascular: Normal rate, regular rhythm and normal heart sounds.  No murmur heard. Pulmonary/Chest: Effort normal and breath sounds normal. No respiratory distress. She has no decreased breath sounds. She has no wheezes. She has no rhonchi. She has no rales.  Abdominal: Soft. Normal appearance and bowel sounds are normal. There is no tenderness. There is no rigidity, no rebound, no guarding and no CVA tenderness. Hernia confirmed negative in the right inguinal area and confirmed negative in the left inguinal area.  Genitourinary:    Pelvic exam was performed with patient in the knee-chest position. There is rash and tenderness on the right labia. There is no lesion or injury on the right labia. There is rash and tenderness on the left labia. There is no lesion or injury on the left labia. Vaginal discharge found.  Genitourinary Comments: Very red and swollen labia- more on right than left side. White vaginal discharge present at vaginal opening. Very tender. No distinct lesions or ulcers seen. Did not perform internal exam due to discomfort.   Musculoskeletal: Normal range of motion.  Lymphadenopathy:    She has no cervical adenopathy. No inguinal adenopathy noted on  the right or left side.  Neurological: She is alert and oriented to person, place, and time.  Skin: Skin is warm and dry.  Psychiatric: She has a normal mood and affect. Her behavior is normal. Judgment and thought content normal.  Vitals reviewed.    UC Treatments / Results  Labs (all labs ordered are listed, but only abnormal results are displayed) Labs Reviewed - No data to display  EKG None  Radiology No results found.  Procedures Procedures (including critical care time)  Medications Ordered in UC Medications  methylPREDNISolone acetate (DEPO-MEDROL) injection 80 mg (80 mg Intramuscular Given 11/19/17 1257)    Initial Impression / Assessment and Plan / UC Course  I have reviewed the triage vital signs and the nursing notes.  Pertinent labs & imaging results that were available during my care  of the patient were reviewed by me and considered in my medical decision making (see chart for details).    Discussed clinical findings with patient. She may be having an allergic skin reaction to the OTC anti-fungal suppository. Gave DepoMedrol 80mg  IM now to help with swelling and itching. Discussed that her symptoms prior to using the anti-fungal appear to be a yeast infection. Will treat with Diflucan 200mg  once daily for 5 days. Encouraged to continue Benadryl 25mg  every 6 hours as needed for itching. May continue cold compresses to area for comfort. Discussed that no herpetic lesions seen externally but may consider testing for HSV if symptoms persist in the next 2 to 3 days. Recommend not applying any topical medication to vaginal area. May wash with water. No soap. Follow-up in 2 to 3 days if not improving.  Final Clinical Impressions(s) / UC Diagnoses   Final diagnoses:  Vaginal irritation  Labial swelling     Discharge Instructions     You were given a steroid shot today to help with irritation, itching and swelling. Recommend start Diflucan 200mg  once daily for 5 days.  Recommend cool compresses to area for comfort. Do not apply any topical medication. Just wash with water. Follow-up in 2 to 3 days if not improving.     ED Prescriptions    Medication Sig Dispense Auth. Provider   fluconazole (DIFLUCAN) 200 MG tablet Take 1 tablet (200 mg total) by mouth daily for 5 days. 5 tablet Sudie Grumbling, NP     Controlled Substance Prescriptions [place] Controlled Substance Registry consulted? Not Applicable   Sudie Grumbling, NP 11/20/17 1128

## 2017-12-04 ENCOUNTER — Encounter (HOSPITAL_COMMUNITY): Payer: Self-pay

## 2017-12-04 ENCOUNTER — Ambulatory Visit (HOSPITAL_COMMUNITY)
Admission: EM | Admit: 2017-12-04 | Discharge: 2017-12-04 | Disposition: A | Discharge status: Home / Self Care | Payer: Self-pay | Attending: Internal Medicine | Admitting: Internal Medicine

## 2017-12-04 DIAGNOSIS — M5442 Lumbago with sciatica, left side: Secondary | ICD-10-CM

## 2017-12-04 MED ORDER — PREDNISONE 10 MG (21) PO TBPK: ORAL_TABLET | Freq: Every day | ORAL | 0 refills | Status: DC

## 2017-12-04 MED ORDER — METHYLPREDNISOLONE SODIUM SUCC 125 MG IJ SOLR
INTRAMUSCULAR | Status: AC
  Filled 2017-12-04: qty 2

## 2017-12-04 MED ORDER — KETOROLAC TROMETHAMINE 30 MG/ML IJ SOLN
INTRAMUSCULAR | Status: AC
  Filled 2017-12-04: qty 1

## 2017-12-04 MED ORDER — KETOROLAC TROMETHAMINE 30 MG/ML IJ SOLN
30.0000 mg | Freq: Once | INTRAMUSCULAR | Status: AC
  Administered 2017-12-04: 30 mg via INTRAMUSCULAR

## 2017-12-04 MED ORDER — HYDROCODONE-ACETAMINOPHEN 5-325 MG PO TABS: 1.0000 | ORAL_TABLET | Freq: Four times a day (QID) | ORAL | 0 refills | Status: DC | PRN

## 2017-12-04 MED ORDER — METHYLPREDNISOLONE SODIUM SUCC 125 MG IJ SOLR
80.0000 mg | Freq: Once | INTRAMUSCULAR | Status: AC
  Administered 2017-12-04: 80 mg via INTRAMUSCULAR

## 2017-12-04 NOTE — ED Provider Notes (Signed)
[facility]    CSN: [id number] Arrival date & time: 12/04/17  1910     History   Chief Complaint Chief Complaint  Patient presents with  . Back Pain    HPI Cassandra Galloway Study is a 27 y.o. female.   27 year old female comes in for one-week history of left buttocks/back pain.  States started intermittent of low  left back pain that has gradually worsened.  States now more with buttock pain that can radiate down the left leg.  She denies injury/trauma.  Denies numbness, tingling, saddle anesthesia, loss of bladder or bowel control.  States has been taking ibuprofen 1200 mg daily for the past 2 days without relief.  States she has a history of sciatica, and symptoms are similar to that.      History reviewed. No pertinent past medical history.  There are no active problems to display for this patient.   History reviewed. No pertinent surgical history.  OB History   None      Home Medications    Prior to Admission medications   Medication Sig Start Date End Date Taking? Authorizing Provider  diphenhydrAMINE (SOMINEX) 25 MG tablet Take 50 mg by mouth daily as needed for itching or sleep.    [provider]  HYDROcodone-acetaminophen (NORCO/VICODIN) 5-325 MG tablet Take 1 tablet by mouth every 6 (six) hours as needed. 12/04/17   Cathie HoopsYu, Ravin Denardo V, PA-C  metroNIDAZOLE (FLAGYL) 500 MG tablet Take all 4 tablets at 1 time. 08/31/13   Reuben LikesKeller, David C, MD  predniSONE (STERAPRED UNI-PAK 21 TAB) 10 MG (21) TBPK tablet Take by mouth daily. Take 6 tabs by mouth day 1, then 5 tabs, then 4 tabs, then 3 tabs, 2 tabs, then 1 tab for the last day 12/04/17   Belinda FisherYu, Ainsley Deakins V, PA-C    Family History History reviewed. No pertinent family history.  Social History Social History   Tobacco Use  . Smoking status: Never Smoker  Substance Use Topics  . Alcohol use: Yes  . Drug use: No     Allergies   Patient has no known allergies.   Review of Systems Review of Systems    Reason unable to perform ROS: See HPI as above.     Physical Exam Triage Vital Signs ED Triage Vitals  Enc Vitals Group     BP 12/04/17 2007 125/65     Pulse Rate 12/04/17 2004 67     Resp 12/04/17 2004 20     Temp 12/04/17 2004 97.7 F (36.5 C)     Temp Source 12/04/17 2004 Temporal     SpO2 12/04/17 2004 99 %     Weight --      Height --      Head Circumference --      Peak Flow --      Pain Score --      Pain Loc --      Pain Edu? --      Excl. in GC? --    No data found.  Updated Vital Signs BP 125/65   Pulse 67   Temp 97.7 F (36.5 C) (Temporal)   Resp 20   LMP 11/22/2017   SpO2 99%   Physical Exam  Constitutional: She is oriented to person, place, and time. She appears well-developed and well-nourished. No distress.  Patient tearful, rocking back and forth.  HENT:  Head: Normocephalic and atraumatic.  Eyes: Pupils are equal, round, and reactive to light. Conjunctivae are normal.  Cardiovascular: Normal rate, regular rhythm and normal heart sounds. Exam reveals no gallop and no friction rub.  No murmur heard. Pulmonary/Chest: Effort normal and breath sounds normal. No accessory muscle usage or stridor. No respiratory distress. She has no decreased breath sounds. She has no wheezes. She has no rhonchi. She has no rales.  Musculoskeletal:  No tenderness on palpation of the spinous processes. No tenderness to palpation of bilateral back. Tenderness to palpation of left buttock and hip. Full passive range of motion of back and hip. Strength deferred due to pain. Sensation intact and equal bilaterally. Positive straight leg raise to the left  Neurological: She is alert and oriented to person, place, and time.  Skin: Skin is warm and dry. She is not diaphoretic.     UC Treatments / Results  Labs (all labs ordered are listed, but only abnormal results are displayed) Labs Reviewed - No data to display  EKG None  Radiology No results  found.  Procedures Procedures (including critical care time)  Medications Ordered in UC Medications  ketorolac (TORADOL) 30 MG/ML injection 30 mg (30 mg Intramuscular Given 12/04/17 2039)  methylPREDNISolone sodium succinate (SOLU-MEDROL) 125 mg/2 mL injection 80 mg (80 mg Intramuscular Given 12/04/17 2041)    Initial Impression / Assessment and Plan / UC Course  I have reviewed the triage vital signs and the nursing notes.  Pertinent labs & imaging results that were available during my care of the patient were reviewed by me and considered in my medical decision making (see chart for details).    Toradol, Solu-Medrol injection in office today.  Prednisone as directed.  Norco for breakthrough pain.  Return precautions given.  Final Clinical Impressions(s) / UC Diagnoses   Final diagnoses:  Acute left-sided low back pain with left-sided sciatica    ED Prescriptions    Medication Sig Dispense Auth. Provider   predniSONE (STERAPRED UNI-PAK 21 TAB) 10 MG (21) TBPK tablet Take by mouth daily. Take 6 tabs by mouth day 1, then 5 tabs, then 4 tabs, then 3 tabs, 2 tabs, then 1 tab for the last day 21 tablet Jamal Pavon V, PA-C   HYDROcodone-acetaminophen (NORCO/VICODIN) 5-325 MG tablet Take 1 tablet by mouth every 6 (six) hours as needed. 10 tablet Threasa Alpha, [place] 12/04/17 2108

## 2017-12-04 NOTE — Discharge Instructions (Signed)
Toradol and Solu-Medrol injection in office today.  Start prednisone as directed.  Ibuprofen 800 mg 3 times a day for pain.  Norco for breakthrough pain.  This can take up to 3-4 weeks to completely resolve, but you should be feeling better each week. Follow up here or with PCP if symptoms worsen, changes for reevaluation. If experience numbness/tingling of the inner thighs, loss of bladder or bowel control, go to the emergency department for evaluation.

## 2017-12-04 NOTE — ED Triage Notes (Signed)
Pt presents with lower back pain that is radiating down into her legs

## 2017-12-10 ENCOUNTER — Emergency Department (HOSPITAL_COMMUNITY)
Admission: EM | Admit: 2017-12-10 | Discharge: 2017-12-10 | Disposition: A | Discharge status: Home / Self Care | Payer: Self-pay | Attending: Emergency Medicine | Admitting: Emergency Medicine

## 2017-12-10 ENCOUNTER — Other Ambulatory Visit: Payer: Self-pay

## 2017-12-10 ENCOUNTER — Encounter (HOSPITAL_COMMUNITY): Payer: Self-pay | Admitting: Emergency Medicine

## 2017-12-10 DIAGNOSIS — F17200 Nicotine dependence, unspecified, uncomplicated: Secondary | ICD-10-CM | POA: Insufficient documentation

## 2017-12-10 DIAGNOSIS — M5442 Lumbago with sciatica, left side: Secondary | ICD-10-CM | POA: Insufficient documentation

## 2017-12-10 MED ORDER — NAPROXEN 500 MG PO TABS: 500.0000 mg | ORAL_TABLET | Freq: Two times a day (BID) | ORAL | 0 refills | Status: DC

## 2017-12-10 MED ORDER — NAPROXEN 250 MG PO TABS
500.0000 mg | ORAL_TABLET | Freq: Once | ORAL | Status: AC
  Administered 2017-12-10: 500 mg via ORAL
  Filled 2017-12-10: qty 2

## 2017-12-10 MED ORDER — LIDOCAINE 5 % EX PTCH: 1.0000 | MEDICATED_PATCH | CUTANEOUS | 0 refills | Status: DC

## 2017-12-10 MED ORDER — METHOCARBAMOL 500 MG PO TABS: 500.0000 mg | ORAL_TABLET | Freq: Three times a day (TID) | ORAL | 0 refills | Status: DC | PRN

## 2017-12-10 NOTE — ED Provider Notes (Signed)
[facility] Provider Note   CSN: [id number] Arrival date & time: 12/10/17  1900     History   Chief Complaint Chief Complaint  Patient presents with  . Back Pain    HPI Cassandra Galloway is a 27 y.o. female with history of tobacco abuse and prior sciatica who presents to the emergency department with complaints of left lower back pain which is been ongoing for 2 to 3 weeks now.  Patient describes the pain as being to the left lower back/buttocks area radiating down the left lower extremity. At times LLE pain feels like paresthesias, but not always. She states the discomfort is fairly constant, worse with certain movements as well as at night when trying to get comfortable.  She was seen by urgent care 08/02 and given prescriptions for prednisone taper and hydrocodone, she states she had no change with the prednisone taper, but hydrocodone seemed to help somewhat.  No specific injury which she can recall.  No specific change in activity. Denies numbness,  weakness, incontinence to bowel/bladder, saddle anesthesia, fever, chills, IV drug use, or hx of cancer.  She reports history of similar 8 years prior when she was diagnosed with sciatica, she cannot recall what seemed to improve her symptoms then.  HPI  No past medical history on file.  There are no active problems to display for this patient.   No past surgical history on file.   OB History   None      Home Medications    Prior to Admission medications   Medication Sig Start Date End Date Taking? Authorizing Provider  diphenhydrAMINE (SOMINEX) 25 MG tablet Take 50 mg by mouth daily as needed for itching or sleep.    [provider]  HYDROcodone-acetaminophen (NORCO/VICODIN) 5-325 MG tablet Take 1 tablet by mouth every 6 (six) hours as needed. 12/04/17   Cathie HoopsYu, Amy V, PA-C  metroNIDAZOLE (FLAGYL) 500 MG tablet Take all 4 tablets at 1 time. 08/31/13   Reuben LikesKeller, David C, MD    predniSONE (STERAPRED UNI-PAK 21 TAB) 10 MG (21) TBPK tablet Take by mouth daily. Take 6 tabs by mouth day 1, then 5 tabs, then 4 tabs, then 3 tabs, 2 tabs, then 1 tab for the last day 12/04/17   Belinda FisherYu, Amy V, PA-C    Family History No family history on file.  Social History Social History   Tobacco Use  . Smoking status: Current Some Day Smoker  . Smokeless tobacco: Never Used  Substance Use Topics  . Alcohol use: Yes    Comment: occasionally  . Drug use: No     Allergies   Patient has no known allergies.   Review of Systems Review of Systems  Gastrointestinal: Negative for abdominal pain.  Genitourinary: Negative for dysuria.  Musculoskeletal: Positive for back pain.  Neurological: Negative for weakness and numbness.       Positive for intermittent left lower extremity paresthesias.  Negative for incontinence or saddle anesthesia.     Physical Exam Updated Vital Signs BP 136/89 (BP Location: Right Arm)   Pulse 66   Temp 98.7 F (37.1 C) (Oral)   Resp 16   Ht 5' (1.524 m)   Wt 88.5 kg   LMP 11/22/2017   SpO2 100%   BMI 38.08 kg/m   Physical Exam  Constitutional: She appears well-developed and well-nourished. No distress.  HENT:  Head: Normocephalic and atraumatic.  Eyes: Conjunctivae are normal. Right eye exhibits no discharge. Left  eye exhibits no discharge.  Abdominal: Soft. She exhibits no distension. There is no tenderness. There is no rebound and no guarding.  Musculoskeletal:  No obvious deformity, appreciable swelling, erythema, ecchymosis, open wounds. Back: Patient has no midline tenderness to palpation.  She does have left lumbar paraspinal muscle tenderness palpation which extends into the left gluteal area. Lower extremities: Normal range of motion, nontender.  Neurological: She is alert.  Clear speech.  Sensation grossly intact bilateral lower extremities.  5 out of 5 strength plantar dorsiflexion bilaterally.  Patellar DTRs are 2+ and symmetric.   Gait is intact but antalgic.  She has some reproducibility in her symptoms with left lower extremity straight leg raise as well as with pulling the left knee to her chest and across her body.  Skin: Skin is warm and dry.  Psychiatric: She has a normal mood and affect. Her behavior is normal. Thought content normal.  Nursing note and vitals reviewed.    ED Treatments / Results  Labs (all labs ordered are listed, but only abnormal results are displayed) Labs Reviewed - No data to display  EKG None  Radiology No results found.  Procedures Procedures (including critical care time)  Medications Ordered in ED Medications  naproxen (NAPROSYN) tablet 500 mg (500 mg Oral Given 12/10/17 2240)    Initial Impression / Assessment and Plan / ED Course  I have reviewed the triage vital signs and the nursing notes.  Pertinent labs & imaging results that were available during my care of the patient were reviewed by me and considered in my medical decision making (see chart for details).    Patient presents with complaint of back pain.  Patient is nontoxic appearing, vitals are WNL. Patient has no neurologic deficits, no midline tenderness to palpation. She is ambulatory in the ED.  No back pain red flags. No urinary sxs. Most likely muscle strain versus spasm with component of sciatica. Considered UTI/pyelonephritis, kidney stone, aortic aneurysm/dissection, cauda equina or epidural abscess however these do not fit clinical picture at this time. Query possible disc disease as well, but feel less likely at this time. Will treat with Naproxen and Robaxin as well as lidoderm patches, discussed with patient that they are not to drive or operate heavy machinery while taking Robaxin. Stretching exercises recommended as well. I discussed treatment plan, need for PCP follow-up, and return precautions with the patient. Provided opportunity for questions, patient confirmed understanding and is in agreement with  plan.   Final Clinical Impressions(s) / ED Diagnoses   Final diagnoses:  Acute left-sided low back pain with left-sided sciatica    ED Discharge Orders         Ordered    naproxen (NAPROSYN) 500 MG tablet  2 times daily     12/10/17 2230    methocarbamol (ROBAXIN) 500 MG tablet  Every 8 hours PRN     12/10/17 2230    lidocaine (LIDODERM) 5 %  Every 24 hours     12/10/17 2230           Cherly Anderson, PA-C 12/10/17 2304    Vanetta Mulders, MD 12/12/17 [id number]

## 2017-12-10 NOTE — Discharge Instructions (Addendum)
Back Pain:  I have prescribed you an anti-inflammatory medication and a muscle relaxer.  - Naproxen is a nonsteroidal anti-inflammatory medication that will help with pain and swelling. Be sure to take this medication as prescribed with food, 1 pill every 12 hours,  It should be taken with food, as it can cause stomach upset, and more seriously, stomach bleeding. Do not take other nonsteroidal anti-inflammatory medications with this such as Advil, Motrin, Aleve, Mobic, Goodie Powder, or Motrin.    - Robaxin is the muscle relaxer I have prescribed, this is meant to help with muscle tightness. Be aware that this medication may make you drowsy therefore the first time you take this it should be at a time you are in an environment where you can rest. Do not drive or operate heavy machinery when taking this medication. Do not drink alcohol or take other sedating medications with this medicine such as narcotics or benzodiazepines.   You make take Tylenol per over the counter dosing with these medications.   We have also given you a prescription for Lidoderm patches, these are topical patches you may place directly over your area of discomfort, these are overly expensive please asked the pharmacist for the over-the-counter version.  We have prescribed you new medication(s) today. Discuss the medications prescribed today with your pharmacist as they can have adverse effects and interactions with your other medicines including over the counter and prescribed medications. Seek medical evaluation if you start to experience new or abnormal symptoms after taking one of these medicines, seek care immediately if you start to experience difficulty breathing, feeling of your throat closing, facial swelling, or rash as these could be indications of a more serious allergic reaction  The application of heat can help soothe the pain.  Maintaining your daily activities, including walking, is encourged, as it will help you  get better faster than just staying in bed.  Low back pain is discomfort in the lower back that may be due to injuries to muscles and ligaments around the spine.  Occasionally, it may be caused by a a problem to a part of the spine called a disc.  The pain may last several days or a few weeks.   Your pain should get better over the next 2 weeks.  You will need to follow up with  Your primary healthcare provider in 1-2 weeks for reassessment, if you do not have a primary care provider one is provided in your discharge instructions. However if you develop severe or worsening pain, low back pain with fever, numbness, weakness, loss of bowel or bladder control, or inability to walk or urinate, you should return to the ER immediately.  Please follow up with your doctor this week for a recheck if still having symptoms.

## 2017-12-10 NOTE — ED Triage Notes (Signed)
Pt reports pain to her back radiating to her butt, and down her L leg. Pt has hx of sciatica.

## 2017-12-14 ENCOUNTER — Encounter: Payer: Self-pay | Admitting: Internal Medicine

## 2017-12-14 ENCOUNTER — Ambulatory Visit: Payer: Self-pay | Attending: Internal Medicine | Admitting: Internal Medicine

## 2017-12-14 VITALS — BP 143/75 | HR 78 | Temp 98.8°F | Resp 16 | Ht 60.0 in | Wt 195.0 lb

## 2017-12-14 DIAGNOSIS — Z79899 Other long term (current) drug therapy: Secondary | ICD-10-CM | POA: Insufficient documentation

## 2017-12-14 DIAGNOSIS — F17200 Nicotine dependence, unspecified, uncomplicated: Secondary | ICD-10-CM | POA: Insufficient documentation

## 2017-12-14 DIAGNOSIS — M5416 Radiculopathy, lumbar region: Secondary | ICD-10-CM | POA: Insufficient documentation

## 2017-12-14 MED ORDER — ACETAMINOPHEN-CODEINE #4 300-60 MG PO TABS: 1.0000 | ORAL_TABLET | Freq: Four times a day (QID) | ORAL | 0 refills | Status: DC | PRN

## 2017-12-14 MED ORDER — KETOROLAC TROMETHAMINE 60 MG/2ML IM SOLN
60.0000 mg | Freq: Once | INTRAMUSCULAR | Status: AC
  Administered 2017-12-14: 60 mg via INTRAMUSCULAR

## 2017-12-14 NOTE — Progress Notes (Signed)
Patient ID: Cassandra Galloway, female    DOB: 1990-09-02  MRN: [id number]  CC: Hospitalization Follow-up (ED f/u )   Subjective: Cassandra Galloway is a 27 y.o. female who presents for new patient visit and ER follow-up Her concerns today include:   Pt c/o pain in LT lower back radiating down leg to foot x 3 wks No initiating factors.  "I got out of bed and my back hurt really bad." Got progressive worse Went to UC 1 wk later.  Dx with sciatica.  Given Hydrocodone and Prednisone.  She was taking the Hydrocodone at nights and it seemed to help.  Subsequently seen in the emergency room 12/10/2017 for the same.  She was given Naprosyn and Robaxin which she feels does not help. No fever No bowel or bladder incontinence  +Numbness and tingling posterior leg and LT big toe.  + burning and aching in LT buttock. 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 diphenhydrAMINE (SOMINEX) 25 MG tablet Take 50 mg by mouth daily as needed for itching or sleep.    Marland Kitchen. HYDROcodone-acetaminophen (NORCO/VICODIN) 5-325 MG tablet Take 1 tablet by mouth every 6 (six) hours as needed. 10 tablet 0  . lidocaine (LIDODERM) 5 % Place 1 patch onto the skin daily. Remove & Discard patch within 12 hours or as directed by MD 30 patch 0  . methocarbamol (ROBAXIN) 500 MG tablet Take 1 tablet (500 mg total) by mouth every 8 (eight) hours as needed. 30 tablet 0  . metroNIDAZOLE (FLAGYL) 500 MG tablet Take all 4 tablets at 1 time. (Patient not taking: Reported on 12/14/2017) 4 tablet 0  . naproxen (NAPROSYN) 500 MG tablet Take 1 tablet (500 mg total) by mouth 2 (two) times daily. 30 tablet 0   No current  facility-administered medications on file prior to visit.     No Known Allergies  Social History   Socioeconomic History  . Marital status: Single    Spouse name: Not on file  . Number of children: Not on file  . Years of education: Not on file  . Highest education level: Not on file  Occupational History  . Not on file  Social Needs  . Financial resource strain: Not on file  . Food insecurity:    Worry: Not on file    Inability: Not on file  . Transportation needs:    Medical: Not on file    Non-medical: Not on file  Tobacco Use  . Smoking status: Current Some Day Smoker  . Smokeless tobacco: Never Used  Substance and Sexual Activity  . Alcohol use: Yes    Comment: occasionally  . Drug use: No  . Sexual activity: Yes    Birth control/protection: IUD  Lifestyle  . Physical activity:    Days per week: Not on file    Minutes per session: Not on file  . Stress: Not on file  Relationships  . Social connections:    Talks on phone: Not on file    Gets together: Not on file    Attends religious service: Not on file    Active member  of club or organization: Not on file    Attends meetings of clubs or organizations: Not on file    Relationship status: Not on file  . Intimate partner violence:    Fear of current or ex partner: Not on file    Emotionally abused: Not on file    Physically abused: Not on file    Forced sexual activity: Not on file  Other Topics Concern  . Not on file  Social History Narrative  . Not on file    No family history on file.  No past surgical history on file.  ROS: Review of Systems Negative except as stated above PHYSICAL EXAM: BP (!) 143/75   Pulse 78   Temp 98.8 F (37.1 C) (Oral)   Resp 16   Ht 5' (1.524 m)   Wt 195 lb (88.5 kg)   LMP 11/22/2017   SpO2 97%   BMI 38.08 kg/m   Physical Exam  General appearance - alert, well appearing, young African-American female who is sitting in a wheelchair and appears uncomfortable.     Mental status - normal mood, behavior, speech, dress, motor activity, and thought processes Neurological -knee and ankle jerk reflexes are decreased bilaterally.  Plantars are downgoing.  Power in right lower extremity is 5/5 proximally and distally.  Power in left lower extremity 4/5 with patient giving to pain.   Gross sensation intact in both lower extremities. Musculoskeletal -patient appears uncomfortable with transfer from wheelchair to exam table.  She has mild tenderness on palpation of the lumbar paraspinal muscles on the left side.   ASSESSMENT AND PLAN: 1. Lumbar radiculopathy Probable slipped disc in the lumbar region. We will try conservative therapy first. Patient given Toradol shot today. Given Tylenol with codeine to use as needed with Naprosyn and Robaxin.  Patient advised that the medication is a narcotic and can cause some drowsiness.  -Use heating pad as needed Advised to avoid any lifting or bending I advised that she stay out of work for at least 1 week but patient states that she cannot afford to do so.  I gave her a note nonetheless in the event she decides to do so. -Follow-up in 2 weeks.  If no improvement we will get an MRI and refer to orthopedics - ketorolac (TORADOL) injection 60 mg - acetaminophen-codeine (TYLENOL #4) 300-60 MG tablet; Take 1 tablet by mouth every 6 (six) hours as needed for moderate pain.  Dispense: 50 tablet; Refill: 0  Patient was given the opportunity to ask questions.  Patient verbalized understanding of the plan and was able to repeat key elements of the plan.   No orders of the defined types were placed in this encounter.    Requested Prescriptions   Signed Prescriptions Disp Refills  . acetaminophen-codeine (TYLENOL #4) 300-60 MG tablet 50 tablet 0    Sig: Take 1 tablet by mouth every 6 (six) hours as needed for moderate pain.    Return in about 2 weeks (around 12/28/2017).  Jonah Blueeborah Gaynelle Pastrana, MD, FACP

## 2017-12-14 NOTE — Progress Notes (Signed)
Pt states the pain is radiating down to her butt on the left side  Pt states she was given medication but it is not working  Pt states she is in a whole lot of pain   Pt states the pain is keeping her from doing daily activites

## 2018-01-18 ENCOUNTER — Ambulatory Visit: Payer: Self-pay | Admitting: Internal Medicine

## 2018-01-20 ENCOUNTER — Encounter

## 2021-11-19 ENCOUNTER — Encounter (HOSPITAL_COMMUNITY): Payer: Self-pay

## 2021-11-19 ENCOUNTER — Ambulatory Visit (INDEPENDENT_AMBULATORY_CARE_PROVIDER_SITE_OTHER): Payer: 59

## 2021-11-19 ENCOUNTER — Ambulatory Visit (HOSPITAL_COMMUNITY)
Admission: RE | Admit: 2021-11-19 | Discharge: 2021-11-19 | Disposition: A | Discharge status: Home / Self Care | Payer: 59 | Source: Ambulatory Visit | Attending: Internal Medicine | Admitting: Internal Medicine

## 2021-11-19 VITALS — BP 137/76 | HR 66 | Temp 97.2°F | Resp 18

## 2021-11-19 DIAGNOSIS — W19XXXA Unspecified fall, initial encounter: Secondary | ICD-10-CM | POA: Diagnosis not present

## 2021-11-19 DIAGNOSIS — S82142A Displaced bicondylar fracture of left tibia, initial encounter for closed fracture: Secondary | ICD-10-CM | POA: Diagnosis not present

## 2021-11-19 DIAGNOSIS — M79672 Pain in left foot: Secondary | ICD-10-CM

## 2021-11-19 DIAGNOSIS — M25562 Pain in left knee: Secondary | ICD-10-CM | POA: Diagnosis not present

## 2021-11-19 MED ORDER — IBUPROFEN 800 MG PO TABS: 800.0000 mg | ORAL_TABLET | Freq: Three times a day (TID) | ORAL | 0 refills | Status: DC

## 2021-11-19 MED ORDER — IBUPROFEN 800 MG PO TABS
ORAL_TABLET | ORAL | Status: AC
  Filled 2021-11-19: qty 1

## 2021-11-19 MED ORDER — IBUPROFEN 800 MG PO TABS
800.0000 mg | ORAL_TABLET | Freq: Once | ORAL | Status: AC
  Administered 2021-11-19: 800 mg via ORAL

## 2021-11-19 NOTE — ED Triage Notes (Signed)
Pt states playing musical chairs on Saturday and got the chair taking away and her lt knee went opposite of her body. Took advil last night with relief.

## 2021-11-19 NOTE — ED Provider Notes (Signed)
[facility]    CSN: [id number] Arrival date & time: 11/19/21  1407      History   Chief Complaint Chief Complaint  Patient presents with   Knee Pain    HPI Cassandra Galloway is a 31 y.o. female.   Patient presents urgent care for evaluation of her left knee and left ankle pain after she was playing musical chairs on a cruise to the [place] on Saturday, November 16, 2021 (3 days ago).  Patient states that she is very competitive and was attempting to sit down in a chair when her left knee bent inwards and her left ankle slid out to the left side causing her knee to "pop".  Patient was able to walk after the initial injury but reports a significant amount of pain.  Patient states that she fell 6 or 7 times after the initial injury playing musical chairs and describes these falls as a result of her "knee giving out on her".  Patient does state that there was alcohol involved during the initial incident.  She denies hitting her head, loss of consciousness, blood thinner use, dizziness, lightheadedness, head pain, and numbness or tingling to the bilateral lower extremities.  Pain to the left knee is currently a 7 on a scale of 0-10 at this time and is worse when she bears weight on her left leg and when she walks.  Patient took 800 mg of ibuprofen yesterday with significant relief of pain.  Pain to the dorsal aspect of the left foot is present and has improved significantly since the initial injury.   Knee Pain   History reviewed. No pertinent past medical history.  There are no problems to display for this patient.   History reviewed. No pertinent surgical history.  OB History   No obstetric history on file.      Home Medications    Prior to Admission medications   Medication Sig Start Date End Date Taking? Authorizing Provider  ibuprofen (ADVIL) 800 MG tablet Take 1 tablet (800 mg total) by mouth 3 (three) times daily. 11/19/21  Yes Carlisle Beers,  FNP  acetaminophen-codeine (TYLENOL #4) 300-60 MG tablet Take 1 tablet by mouth every 6 (six) hours as needed for moderate pain. 12/14/17   Marcine Matar, MD  diphenhydrAMINE (SOMINEX) 25 MG tablet Take 50 mg by mouth daily as needed for itching or sleep.    [provider]  metroNIDAZOLE (FLAGYL) 500 MG tablet Take all 4 tablets at 1 time. Patient not taking: Reported on 12/14/2017 08/31/13   Reuben Likes, MD    Family History History reviewed. No pertinent family history.  Social History Social History   Tobacco Use   Smoking status: Some Days   Smokeless tobacco: Never  Substance Use Topics   Alcohol use: Yes    Comment: occasionally   Drug use: No     Allergies   Patient has no known allergies.   Review of Systems Review of Systems Per HPI  Physical Exam Triage Vital Signs ED Triage Vitals [11/19/21 1423]  Enc Vitals Group     BP 137/76     Pulse Rate 66     Resp 18     Temp (!) 97.2 F (36.2 C)     Temp Source Oral     SpO2 98 %     Weight      Height      Head Circumference      Peak Flow  Pain Score 6     Pain Loc      Pain Edu?      Excl. in GC?    No data found.  Updated Vital Signs BP 137/76 (BP Location: Left Arm)   Pulse 66   Temp (!) 97.2 F (36.2 C) (Oral)   Resp 18   LMP 11/11/2021   SpO2 98%   Visual Acuity Right Eye Distance:   Left Eye Distance:   Bilateral Distance:    Right Eye Near:   Left Eye Near:    Bilateral Near:     Physical Exam Vitals and nursing note reviewed.  Constitutional:      Appearance: Normal appearance. She is not ill-appearing or toxic-appearing.     Comments: Very pleasant patient sitting on exam in position of comfort table in no acute distress.   HENT:     Head: Normocephalic and atraumatic.     Right Ear: Hearing and external ear normal.     Left Ear: Hearing and external ear normal.     Nose: Nose normal.     Mouth/Throat:     Lips: Pink.     Mouth: Mucous membranes are  moist.  Eyes:     General: Lids are normal. Vision grossly intact. Gaze aligned appropriately.     Extraocular Movements: Extraocular movements intact.     Conjunctiva/sclera: Conjunctivae normal.  Cardiovascular:     Heart sounds: S1 normal and S2 normal.  Pulmonary:     Effort: No respiratory distress.     Breath sounds: Normal air entry.  Musculoskeletal:        General: Swelling present.     Cervical back: Neck supple.     Right foot: Normal.     Left foot: Tenderness present.     Comments: Left foot: Tenderness to the dorsal aspect of the left foot present with palpation.  Capillary refill less than 3.  +2 dorsalis pedis pulses bilaterally.  Range of motion is not limited by tenderness.  No pain to palpation of the bilateral ankles. Left calf: Mild tenderness with palpation of the left calf muscle.  No warmth, erythema, or swelling present to the left calf.  Negative Homans' sign bilaterally.  Left knee: Mild bruising present to the lateral aspect of the left knee.  Full range of motion present to the left knee that is not limited by tenderness with flexion and extension.  Strength intact.  Sensation intact bilaterally.  No popliteal erythema, swelling, or warmth present.  +2 popliteal pulse present to the left.  Skin:    General: Skin is warm and dry.     Capillary Refill: Capillary refill takes less than 2 seconds.     Findings: No rash.  Neurological:     General: No focal deficit present.     Mental Status: She is alert and oriented to person, place, and time. Mental status is at baseline.     Cranial Nerves: No dysarthria or facial asymmetry.     Gait: Gait is intact.     Comments: No focal neurologic deficit present.  Patient able to ambulate with steady gait.  Psychiatric:        Mood and Affect: Mood normal.        Speech: Speech normal.        Behavior: Behavior normal.        Thought Content: Thought content normal.        Judgment: Judgment normal.      UC  Treatments / Results  Labs (all labs ordered are listed, but only abnormal results are displayed) Labs Reviewed - No data to display  EKG   Radiology DG Foot Complete Left  Result Date: 11/19/2021 CLINICAL DATA:  Fall EXAM: LEFT FOOT - COMPLETE 4 VIEW COMPARISON:  None Available. FINDINGS: There is no evidence of fracture or dislocation. There is no evidence of arthropathy or other focal bone abnormality. Soft tissues are unremarkable. IMPRESSION: Negative. Electronically Signed   By: Allegra Lai M.D.   On: 11/19/2021 16:13   DG Knee Complete 4 Views Left  Result Date: 11/19/2021 CLINICAL DATA:  Fall pain EXAM: LEFT KNEE - COMPLETE 4+ VIEW COMPARISON:  None Available. FINDINGS: Segond fracture of the lateral tibial plateau. No evidence of macroscopic fracture or dislocation. Moderate knee joint effusion. No evidence of arthropathy or other focal bone abnormality. Soft tissues are unremarkable. IMPRESSION: 1. Segond fracture of the lateral tibial plateau, characteristic of lateral capsular avulsion and strongly associated with ACL injury. 2.  No evidence of macroscopic fracture or dislocation. 3.  Moderate knee joint effusion. Electronically Signed   By: Jearld Lesch M.D.   On: 11/19/2021 15:54    Procedures Procedures (including critical care time)  Medications Ordered in UC Medications  ibuprofen (ADVIL) tablet 800 mg (800 mg Oral Given 11/19/21 1513)    Initial Impression / Assessment and Plan / UC Course  I have reviewed the triage vital signs and the nursing notes.  Pertinent labs & imaging results that were available during my care of the patient were reviewed by me and considered in my medical decision making (see chart for details).   1. Left segond fracture of the tibial plateau Left knee x-ray shows Segond fracture of the lateral tibial plateau characteristic of lateral capsular avulsion and strongly associated with ACL injury. Consulted with Dr. August Saucer orthopedist who  recommends ace wrap and knee immobilizer with crutches and strict non-weight bearing restrictions until follow-up in the office. He would like for patient to have an MRI of the left knee and plans to have his staff schedule this as soon as possible. Patient aware that she will be receiving a phone call to schedule MRI soon. Patient to call phone number on paperwork for Dr. Diamantina Providence office to schedule appointment for as soon as possible for follow-up. She may continue use of ibuprofen 800mg  at home every 8 hours as needed for pain with food to avoid GI upset. Next dose of ibuprofen may be at 11:15pm as she was given some medicine in the clinic today for pain relief. Ace wrap, knee immobilizer, and crutches placed. Patient education regarding orthopedic devices provided.  Discussed physical exam and available lab work findings in clinic with patient.  Counseled patient regarding appropriate use of medications and potential side effects for all medications recommended or prescribed today. Discussed red flag signs and symptoms of worsening condition,when to call the PCP office, return to urgent care, and when to seek higher level of care in the emergency department. Patient verbalizes understanding and agreement with plan. All questions answered. Patient discharged in stable condition.  Final Clinical Impressions(s) / UC Diagnoses   Final diagnoses:  Closed fracture of left tibial plateau, initial encounter  Acute pain of left knee     Discharge Instructions      Per Dr. orthopedist, you may not bear weight on your left leg until your follow-up appointment with orthopedics.  Wear the knee immobilizer provided with the Ace wrap underneath at all  times unless you are in the shower.   You will be receiving a phone call from Dr. Diamantina Providence office to schedule an MRI of your left knee to evaluate for a possible ACL injury.  He is hoping to get this done before you follow-up with him in the office soon.  Call  the phone number on your paperwork to follow-up with Dr. August Saucer in his office in [place] as soon as possible.  Keep your phone on ringer as his nursing staff will be calling you regarding your MRI once he placed the order in.   You may continue taking ibuprofen 800 mg every 8 hours as needed for pain and inflammation.  Your next dose of ibuprofen may be at 11:15 PM tonight.  Take this medicine with food to avoid stomach upset.      ED Prescriptions     Medication Sig Dispense Auth. Provider   ibuprofen (ADVIL) 800 MG tablet Take 1 tablet (800 mg total) by mouth 3 (three) times daily. 21 tablet Carlisle Beers, FNP      PDMP not reviewed this encounter.   Carlisle Beers, [place] 11/21/21 1524

## 2021-11-19 NOTE — Discharge Instructions (Addendum)
Per Dr. August Saucer orthopedist, you may not bear weight on your left leg until your follow-up appointment with orthopedics.  Wear the knee immobilizer provided with the Ace wrap underneath at all times unless you are in the shower.   You will be receiving a phone call from Dr. Diamantina Providence office to schedule an MRI of your left knee to evaluate for a possible ACL injury.  He is hoping to get this done before you follow-up with him in the office soon.  Call the phone number on your paperwork to follow-up with Dr. August Saucer in his office in [place] as soon as possible.  Keep your phone on ringer as his nursing staff will be calling you regarding your MRI once he placed the order in.   You may continue taking ibuprofen 800 mg every 8 hours as needed for pain and inflammation.  Your next dose of ibuprofen may be at 11:15 PM tonight.  Take this medicine with food to avoid stomach upset.

## 2021-11-26 ENCOUNTER — Ambulatory Visit: Payer: 59 | Admitting: Orthopaedic Surgery

## 2021-11-26 ENCOUNTER — Encounter: Payer: Self-pay | Admitting: Orthopaedic Surgery

## 2021-11-26 DIAGNOSIS — M25562 Pain in left knee: Secondary | ICD-10-CM | POA: Diagnosis not present

## 2021-11-26 NOTE — Addendum Note (Signed)
Addended by: Wendi Maya on: 11/26/2021 03:48 PM   Modules accepted: Orders

## 2021-11-26 NOTE — Progress Notes (Signed)
Office Visit Note   Patient: Cassandra Galloway           Date of Birth: 1990-09-20           MRN: [id number] Visit Date: 11/26/2021              Requested by: Marcine Matar, MD [address] [place] [address] [place],  [place] [address] PCP: Marcine Matar, MD   Assessment & Plan: Visit Diagnoses:  1. Acute pain of left knee     Plan: Impression is left knee ACL injury.  May have a component of MCL injury as well.  Given findings of the Segond fracture on x-ray will need MRI to look for ACL injury.  Will contact patient through MyChart after the MRI.    Follow-Up Instructions: No follow-ups on file.   Orders:  No orders of the defined types were placed in this encounter.  No orders of the defined types were placed in this encounter.     Procedures: No procedures performed   Clinical Data: No additional findings.   Subjective: Chief Complaint  Patient presents with   Left Knee - Injury    DOI 11/19/2021    HPI Cassandra Galloway is a healthy 31 year old female here for acute left knee injury on 11/16/2021.  She was in the [place] and playing musical chairs when she went to sit down and her knee buckled in different direction and felt a pop.  Subsequently developed knee effusion.  [place] and x-rays showed a Segond fracture.  She was placed in a hinged knee brace and crutches.  Review of Systems  Constitutional: Negative.   HENT: Negative.    Eyes: Negative.   Respiratory: Negative.    Cardiovascular: Negative.   Endocrine: Negative.   Musculoskeletal: Negative.   Neurological: Negative.   Hematological: Negative.   Psychiatric/Behavioral: Negative.    All other systems reviewed and are negative.    Objective: Vital Signs: LMP 11/11/2021   Physical Exam Vitals and nursing note reviewed.  Constitutional:      Appearance: She is well-developed.  HENT:     Head: Atraumatic.     Nose: Nose normal.  Eyes:      Extraocular Movements: Extraocular movements intact.  Cardiovascular:     Pulses: Normal pulses.  Pulmonary:     Effort: Pulmonary effort is normal.  Abdominal:     Palpations: Abdomen is soft.  Musculoskeletal:     Cervical back: Neck supple.  Skin:    General: Skin is warm.     Capillary Refill: Capillary refill takes less than 2 seconds.  Neurological:     Mental Status: She is alert. Mental status is at baseline.  Psychiatric:        Behavior: Behavior normal.        Thought Content: Thought content normal.        Judgment: Judgment normal.     Ortho Exam Examination of the left knee shows small effusion.  Laxity of the ACL with Lachman and anterior drawer.  Medial joint line tenderness as well as tenderness along the MCL. Specialty Comments:  No specialty comments available.  Imaging: No results found.   PMFS History: There are no problems to display for this patient.  History reviewed. No pertinent past medical history.  No family history on file.  History reviewed. No pertinent surgical history. Social History   Occupational History   Not on file  Tobacco Use   Smoking status: Some Days   Smokeless tobacco: Never  Substance and Sexual Activity   Alcohol use: Yes    Comment: occasionally   Drug use: No   Sexual activity: Yes    Birth control/protection: None

## 2021-12-12 ENCOUNTER — Ambulatory Visit
Admission: RE | Admit: 2021-12-12 | Discharge: 2021-12-12 | Disposition: A | Discharge status: Home / Self Care | Payer: 59 | Source: Ambulatory Visit | Attending: Orthopaedic Surgery | Admitting: Orthopaedic Surgery

## 2021-12-12 DIAGNOSIS — M25562 Pain in left knee: Secondary | ICD-10-CM

## 2021-12-12 NOTE — Progress Notes (Signed)
Needs appt for MRI

## 2021-12-18 ENCOUNTER — Telehealth: Payer: Self-pay | Admitting: Radiology

## 2021-12-18 NOTE — Telephone Encounter (Signed)
Patient is calling to check the status of what is next- states that she had the CT scan and she was told that our office would call her and she has not heard anything. Please call her to advise [phone]

## 2021-12-19 ENCOUNTER — Telehealth: Payer: Self-pay | Admitting: Orthopaedic Surgery

## 2021-12-19 NOTE — Telephone Encounter (Signed)
Spoke to patient and went over MRI.  She understands that she will also need to come in to talk to dr. Roda Shutters about surgery.  Scheduled for next week

## 2021-12-19 NOTE — Telephone Encounter (Signed)
Pt called and made an appt. But really would like to go over results of MRI over the phone. Please call pt at [phone].

## 2021-12-27 ENCOUNTER — Ambulatory Visit: Payer: 59 | Admitting: Orthopaedic Surgery

## 2021-12-27 DIAGNOSIS — M25562 Pain in left knee: Secondary | ICD-10-CM

## 2021-12-27 NOTE — Progress Notes (Signed)
   Office Visit Note   Patient: Cassandra Galloway           Date of Birth: 1990/08/02           MRN: [id number] Visit Date: 12/27/2021              Requested by: Marcine Matar, MD [address] [place],  [place] [address] PCP: Marcine Matar, MD   Assessment & Plan: Visit Diagnoses:  1. Acute pain of left knee     Plan:.  He returns today with her significant other to discuss left knee MRI scan.  States that the pain has improved overall.  Continues to wear the hinged knee brace.  She definitely feels that the knee is unstable when she does not wear it.  I reviewed the MRI with her which does show complete ACL tear.  There is some meniscal pathology as well which may or may not need surgical repair.  Given these findings I have recommended surgical consultation with Dr. August Saucer.  Follow-Up Instructions: No follow-ups on file.   Orders:  No orders of the defined types were placed in this encounter.  No orders of the defined types were placed in this encounter.     Procedures: No procedures performed   Clinical Data: No additional findings.   Subjective: Chief Complaint  Patient presents with   Left Knee - Pain    HPI  Review of Systems   Objective: Vital Signs: There were no vitals taken for this visit.  Physical Exam  Ortho Exam  Specialty Comments:  No specialty comments available.  Imaging: No results found.   PMFS History: There are no problems to display for this patient.  No past medical history on file.  No family history on file.  No past surgical history on file. Social History   Occupational History   Not on file  Tobacco Use   Smoking status: Some Days   Smokeless tobacco: Never  Substance and Sexual Activity   Alcohol use: Yes    Comment: occasionally   Drug use: No   Sexual activity: Yes    Birth control/protection: None

## 2022-01-20 ENCOUNTER — Ambulatory Visit: Payer: 59 | Admitting: Orthopedic Surgery

## 2022-01-20 DIAGNOSIS — S83512D Sprain of anterior cruciate ligament of left knee, subsequent encounter: Secondary | ICD-10-CM | POA: Diagnosis not present

## 2022-01-21 ENCOUNTER — Encounter: Payer: Self-pay | Admitting: Orthopedic Surgery

## 2022-01-21 NOTE — Progress Notes (Signed)
Office Visit Note   Patient: Cassandra Galloway           Date of Birth: 02/17/1991           MRN: [id number] Visit Date: 01/20/2022 Requested by: Ladell Pier, MD [place],  [place] [id number] PCP: Ladell Pier, MD  Subjective: Chief Complaint  Patient presents with   Left Knee - Pain    HPI: Patient presents for evaluation of her left knee.  Date of injury 11/16/2021.  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  She fell and her knee popped.  Has been taking ibuprofen which helps some.  Does not really currently report any pain in the left knee but does report some occasional symptomatic instability.  She feels like her knee hyperextends at times.  She has been using a brace early on after the injury but recently over the past several weeks has not used a brace and in general is able to get around.  Reports some swelling last week.  She does predominantly sitdown work.  No personal or family history of DVT or pulmonary embolism.  She does not do much in the way of sporting activities and does not do much in the way of cutting or pivoting activities.              ROS: All systems reviewed are negative as they relate to the chief complaint within the history of present illness.  Patient denies  fevers or chills.   Assessment & Plan: Visit Diagnoses:  1. Rupture of anterior cruciate ligament of left knee, subsequent encounter     Plan: Impression is left knee ACL tear with medial meniscal tear.  The medial meniscal tear is a vertical tear which should be amenable to repair.  The risk and benefits of surgery are discussed with the patient including not limited to infection nerve vessel damage knee stiffness as well as potential need for further surgery.  The patient's main goal is to be able to get back into the gym so she can lose weight.  We talked about autograft and allograft options.  I think her best option for quickest  recovery with stable knee would be autograft hamstring ACL reconstruction with inside-out medial meniscal repair.  The risk and benefits of the procedure are discussed with the patient including not limited to infection nerve vessel damage knee stiffness and all the other complications listed above.  Patient understands the risk and benefits and would like to proceed.  All questions answered.  I do think that based on her short stature that quad autograft is not a great option.  For her level of activity a quadrupled hamstring and bacillus autograft of the appropriate length should be good for stability.  Would aim for minimum 9 mm graft.  Follow-Up Instructions: No follow-ups on file.   Orders:  No orders of the defined types were placed in this encounter.  No orders of the defined types were placed in this encounter.     Procedures: No procedures performed   Clinical Data: No additional findings.  Objective: Vital Signs: There were no vitals taken for this visit.  Physical Exam:   Constitutional: Patient appears well-developed HEENT:  Head: Normocephalic Eyes:EOM are normal Neck: Normal range of motion Cardiovascular: Normal rate Pulmonary/chest: Effort normal Neurologic: Patient is alert Skin: Skin is warm Psychiatric: Patient has normal mood and affect   Ortho Exam: Ortho exam demonstrates  normal gait and alignment.  Left knee has increased Lachman and anterior drawer compared to the right.  There is no posterolateral rotatory instability.  Collaterals are stable to varus stress at 0 and 30 degrees.  No masses lymphadenopathy or skin changes noted in the left knee region.  Range of motion is about 5 degrees of hyperextension bilaterally to full flexion of 125.  No effusion is present.  Specialty Comments:  No specialty comments available.  Imaging: No results found.   PMFS History: There are no problems to display for this patient.  History reviewed. No pertinent  past medical history.  History reviewed. No pertinent family history.  History reviewed. No pertinent surgical history. Social History   Occupational History   Not on file  Tobacco Use   Smoking status: Some Days   Smokeless tobacco: Never  Substance and Sexual Activity   Alcohol use: Yes    Comment: occasionally   Drug use: No   Sexual activity: Yes    Birth control/protection: None

## 2022-01-29 ENCOUNTER — Other Ambulatory Visit: Payer: Self-pay

## 2022-02-05 NOTE — Progress Notes (Signed)
Surgical Instructions    Your procedure is scheduled on Tuesday, October 10th, 2023.   Report to [facility] Main Entrance "A" at 09:30 A.M., then check in with the Admitting office.  Call this number if you have problems the morning of surgery:  [phone]   If you have any questions prior to your surgery date call [phone]: Open Monday-Friday 8am-4pm If you experience any cold or flu symptoms such as cough, fever, chills, shortness of breath, etc. between now and your scheduled surgery, please notify us at the above number     Remember:  Do not eat after midnight the night before your surgery  You may drink clear liquids until 08:30 the morning of your surgery.   Clear liquids allowed are: Water, Non-Citrus Juices (without pulp), Carbonated Beverages, Clear Tea, Black Coffee ONLY (NO MILK, CREAM OR POWDERED CREAMER of any kind), and Gatorade    Take these medicines the morning of surgery with A SIP OF WATER: NONE   As of today, STOP taking any Aspirin (unless otherwise instructed by your surgeon) Aleve, Naproxen, Ibuprofen, Motrin, Advil, Goody's, BC's, all herbal medications, fish oil, and all vitamins.   The day of surgery:          Do not wear jewelry or makeup. Do not wear lotions, powders, perfumes or deodorant. Do not shave 48 hours prior to surgery.   Do not bring valuables to the hospital. Do not wear nail polish, gel polish, artificial nails, or any other type of covering on natural nails (fingers and toes) If you have artificial nails or gel coating that need to be removed by a nail salon, please have this removed prior to surgery. Artificial nails or gel coating may interfere with anesthesia's ability to adequately monitor your vital signs.  [facility] is not responsible for any belongings or valuables.    Do NOT Smoke (Tobacco/Vaping)  24 hours prior to your procedure  If you use a CPAP at night, you may bring your mask for your overnight stay.   Contacts,  glasses, hearing aids, dentures or partials may not be worn into surgery, please bring cases for these belongings   For patients admitted to the hospital, discharge time will be determined by your treatment team.   Patients discharged the day of surgery will not be allowed to drive home, and someone needs to stay with them for 24 hours.   SURGICAL WAITING ROOM VISITATION Patients having surgery or a procedure may have no more than 2 support people in the waiting area - these visitors may rotate.   Children under the age of 20 must have an adult with them who is not the patient. If the patient needs to stay at the hospital during part of their recovery, the visitor guidelines for inpatient rooms apply. Pre-op nurse will coordinate an appropriate time for 1 support person to accompany patient in pre-op.  This support person may not rotate.   Please refer to the [facility] website for the visitor guidelines for Inpatients (after your surgery is over and you are in a regular room).    Special instructions:    Oral Hygiene is also important to reduce your risk of infection.  Remember - BRUSH YOUR TEETH THE MORNING OF SURGERY WITH YOUR REGULAR TOOTHPASTE   [facility]- Preparing For Surgery  Before surgery, you can play an important role. Because skin is not sterile, your skin needs to be as free of germs as possible. You can reduce the number of  germs on your skin by washing with CHG (chlorahexidine gluconate) Soap before surgery.  CHG is an antiseptic cleaner which kills germs and bonds with the skin to continue killing germs even after washing.     Please do not use if you have an allergy to CHG or antibacterial soaps. If your skin becomes reddened/irritated stop using the CHG.  Do not shave (including legs and underarms) for at least 48 hours prior to first CHG shower. It is OK to shave your face.  Please follow these instructions carefully.     Shower the NIGHT BEFORE SURGERY and the  MORNING OF SURGERY with CHG Soap.   If you chose to wash your hair, wash your hair first as usual with your normal shampoo. After you shampoo, rinse your hair and body thoroughly to remove the shampoo.  Then [employer] and genitals (private parts) with your normal soap and rinse thoroughly to remove soap.  After that Use CHG Soap as you would any other liquid soap. You can apply CHG directly to the skin and wash gently with a scrungie or a clean washcloth.   Apply the CHG Soap to your body ONLY FROM THE NECK DOWN.  Do not use on open wounds or open sores. Avoid contact with your eyes, ears, mouth and genitals (private parts). Wash Face and genitals (private parts)  with your normal soap.   Wash thoroughly, paying special attention to the area where your surgery will be performed.  Thoroughly rinse your body with warm water from the neck down.  DO NOT shower/wash with your normal soap after using and rinsing off the CHG Soap.  Pat yourself dry with a CLEAN TOWEL.  Wear CLEAN PAJAMAS to bed the night before surgery  Place CLEAN SHEETS on your bed the night before your surgery  DO NOT SLEEP WITH PETS.   Day of Surgery:  Take a shower with CHG soap. Wear Clean/Comfortable clothing the morning of surgery Do not apply any deodorants/lotions.   Remember to brush your teeth WITH YOUR REGULAR TOOTHPASTE.    If you received a COVID test during your pre-op visit, it is requested that you wear a mask when out in public, stay away from anyone that may not be feeling well, and notify your surgeon if you develop symptoms. If you have been in contact with anyone that has tested positive in the last 10 days, please notify your surgeon.    Please read over the following fact sheets that you were given.

## 2022-02-06 ENCOUNTER — Encounter (HOSPITAL_COMMUNITY)
Admission: RE | Admit: 2022-02-06 | Discharge: 2022-02-06 | Disposition: A | Discharge status: Home / Self Care | Payer: 59 | Source: Ambulatory Visit | Attending: Orthopedic Surgery | Admitting: Orthopedic Surgery

## 2022-02-06 ENCOUNTER — Other Ambulatory Visit: Payer: Self-pay

## 2022-02-06 ENCOUNTER — Encounter (HOSPITAL_COMMUNITY): Payer: Self-pay

## 2022-02-06 VITALS — BP 116/53 | HR 49 | Temp 98.2°F | Resp 17 | Ht 62.0 in | Wt 228.7 lb

## 2022-02-06 DIAGNOSIS — Z01818 Encounter for other preprocedural examination: Secondary | ICD-10-CM | POA: Diagnosis not present

## 2022-02-06 HISTORY — DX: Anemia, unspecified: D64.9

## 2022-02-06 LAB — CBC
HCT: 36.4 % (ref 36.0–46.0)
Hemoglobin: 11.9 g/dL — ABNORMAL LOW (ref 12.0–15.0)
MCH: 31.2 pg (ref 26.0–34.0)
MCHC: 32.7 g/dL (ref 30.0–36.0)
MCV: 95.5 fL (ref 80.0–100.0)
Platelets: 291 10*3/uL (ref 150–400)
RBC: 3.81 MIL/uL — ABNORMAL LOW (ref 3.87–5.11)
RDW: 12.5 % (ref 11.5–15.5)
WBC: 7.5 10*3/uL (ref 4.0–10.5)
nRBC: 0 % (ref 0.0–0.2)

## 2022-02-06 LAB — BASIC METABOLIC PANEL
Anion gap: 8 (ref 5–15)
BUN: 9 mg/dL (ref 6–20)
CO2: 24 mmol/L (ref 22–32)
Calcium: 9 mg/dL (ref 8.9–10.3)
Chloride: 105 mmol/L (ref 98–111)
Creatinine, Ser: 0.66 mg/dL (ref 0.44–1.00)
GFR, Estimated: >60 mL/min (ref >60)
Glucose, Bld: 98 mg/dL (ref 70–99)
Potassium: 4 mmol/L (ref 3.5–5.1)
Sodium: 137 mmol/L (ref 135–145)

## 2022-02-06 NOTE — Progress Notes (Signed)
Surgical Instructions    Your procedure is scheduled on Tuesday, October 10th, 2023.   Report to [facility] Main Entrance "A" at 09:30 A.M., then check in with the Admitting office.  Call this number if you have problems the morning of surgery:  [phone]   If you have any questions prior to your surgery date call [phone]: Open Monday-Friday 8am-4pm If you experience any cold or flu symptoms such as cough, fever, chills, shortness of breath, etc. between now and your scheduled surgery, please notify us at the above number     Remember:  Do not eat after midnight the night before your surgery  You may drink clear liquids until 08:30 the morning of your surgery.   Clear liquids allowed are: Water, Non-Citrus Juices (without pulp), Carbonated Beverages, Clear Tea, Black Coffee ONLY (NO MILK, CREAM OR POWDERED CREAMER of any kind), and Gatorade  Patient Instructions  The night before surgery:  No food after midnight. ONLY clear liquids after midnight  The day of surgery (if you do NOT have diabetes):  Drink ONE (1) Pre-Surgery Clear Ensure by 8:30am the morning of surgery. Drink in one sitting. Do not sip.  This drink was given to you during your hospital  pre-op appointment visit. Nothing else to drink after completing the  Pre-Surgery Clear Ensure.           If you have questions, please contact your surgeon's office.     Take these medicines the morning of surgery with A SIP OF WATER: NONE   As of today, STOP taking any Aspirin (unless otherwise instructed by your surgeon) Aleve, Naproxen, Ibuprofen, Motrin, Advil, Goody's, BC's, all herbal medications, fish oil, and all vitamins.   The day of surgery:          Do not wear jewelry or makeup. Do not wear lotions, powders, perfumes or deodorant. Do not shave 48 hours prior to surgery.   Do not bring valuables to the hospital. Do not wear nail polish, gel polish, artificial nails, or any other type of covering on  natural nails (fingers and toes) If you have artificial nails or gel coating that need to be removed by a nail salon, please have this removed prior to surgery. Artificial nails or gel coating may interfere with anesthesia's ability to adequately monitor your vital signs.  [facility] is not responsible for any belongings or valuables.    Do NOT Smoke (Tobacco/Vaping)  24 hours prior to your procedure  If you use a CPAP at night, you may bring your mask for your overnight stay.   Contacts, glasses, hearing aids, dentures or partials may not be worn into surgery, please bring cases for these belongings   For patients admitted to the hospital, discharge time will be determined by your treatment team.   Patients discharged the day of surgery will not be allowed to drive home, and someone needs to stay with them for 24 hours.   SURGICAL WAITING ROOM VISITATION Patients having surgery or a procedure may have no more than 2 support people in the waiting area - these visitors may rotate.   Children under the age of 75 must have an adult with them who is not the patient. If the patient needs to stay at the hospital during part of their recovery, the visitor guidelines for inpatient rooms apply. Pre-op nurse will coordinate an appropriate time for 1 support person to accompany patient in pre-op.  This support person may not rotate.   Please  refer to the [facility] website for the visitor guidelines for Inpatients (after your surgery is over and you are in a regular room).    Special instructions:    Oral Hygiene is also important to reduce your risk of infection.  Remember - BRUSH YOUR TEETH THE MORNING OF SURGERY WITH YOUR REGULAR TOOTHPASTE   - Preparing For Surgery  Before surgery, you can play an important role. Because skin is not sterile, your skin needs to be as free of germs as possible. You can reduce the number of germs on your skin by washing with CHG (chlorahexidine  gluconate) Soap before surgery.  CHG is an antiseptic cleaner which kills germs and bonds with the skin to continue killing germs even after washing.     Please do not use if you have an allergy to CHG or antibacterial soaps. If your skin becomes reddened/irritated stop using the CHG.  Do not shave (including legs and underarms) for at least 48 hours prior to first CHG shower. It is OK to shave your face.  Please follow these instructions carefully.     Shower the NIGHT BEFORE SURGERY and the MORNING OF SURGERY with CHG Soap.   If you chose to wash your hair, wash your hair first as usual with your normal shampoo. After you shampoo, rinse your hair and body thoroughly to remove the shampoo.  Then [employer] and genitals (private parts) with your normal soap and rinse thoroughly to remove soap.  After that Use CHG Soap as you would any other liquid soap. You can apply CHG directly to the skin and wash gently with a scrungie or a clean washcloth.   Apply the CHG Soap to your body ONLY FROM THE NECK DOWN.  Do not use on open wounds or open sores. Avoid contact with your eyes, ears, mouth and genitals (private parts). Wash Face and genitals (private parts)  with your normal soap.   Wash thoroughly, paying special attention to the area where your surgery will be performed.  Thoroughly rinse your body with warm water from the neck down.  DO NOT shower/wash with your normal soap after using and rinsing off the CHG Soap.  Pat yourself dry with a CLEAN TOWEL.  Wear CLEAN PAJAMAS to bed the night before surgery  Place CLEAN SHEETS on your bed the night before your surgery  DO NOT SLEEP WITH PETS.   Day of Surgery:  Take a shower with CHG soap. Wear Clean/Comfortable clothing the morning of surgery Do not apply any deodorants/lotions.   Remember to brush your teeth WITH YOUR REGULAR TOOTHPASTE.    If you received a COVID test during your pre-op visit, it is requested that you wear a mask  when out in public, stay away from anyone that may not be feeling well, and notify your surgeon if you develop symptoms. If you have been in contact with anyone that has tested positive in the last 10 days, please notify your surgeon.    Please read over the following fact sheets that you were given.

## 2022-02-06 NOTE — Progress Notes (Signed)
PCP - no Cardiologist - denies  PPM/ICD - denies   Chest x-ray - n/a EKG - n/a Stress Test -denies  ECHO - denies Cardiac Cath - denies  Sleep Study - denies  Follow your surgeon's instructions on when to stop Aspirin.  If no instructions were given by your surgeon then you will need to call the office to get those instructions.     ERAS Protcol -yes PRE-SURGERY Ensure or G2- ensure ordered and given  COVID TEST- not needed   Anesthesia review: no  Patient denies shortness of breath, fever, cough and chest pain at PAT appointment   All instructions explained to the patient, with a verbal understanding of the material. Patient agrees to go over the instructions while at home for a better understanding. Patient also instructed to self quarantine after being tested for COVID-19. The opportunity to ask questions was provided.

## 2022-02-11 ENCOUNTER — Ambulatory Visit (HOSPITAL_COMMUNITY): Payer: 59

## 2022-02-11 ENCOUNTER — Other Ambulatory Visit: Payer: Self-pay

## 2022-02-11 ENCOUNTER — Ambulatory Visit (HOSPITAL_COMMUNITY): Payer: 59 | Admitting: Anesthesiology

## 2022-02-11 ENCOUNTER — Encounter (HOSPITAL_COMMUNITY): Payer: Self-pay | Admitting: Orthopedic Surgery

## 2022-02-11 ENCOUNTER — Ambulatory Visit (HOSPITAL_COMMUNITY)
Admission: RE | Admit: 2022-02-11 | Discharge: 2022-02-11 | Disposition: A | Discharge status: Home / Self Care | Payer: 59 | Attending: Orthopedic Surgery | Admitting: Orthopedic Surgery

## 2022-02-11 ENCOUNTER — Ambulatory Visit (HOSPITAL_BASED_OUTPATIENT_CLINIC_OR_DEPARTMENT_OTHER): Payer: 59 | Admitting: Anesthesiology

## 2022-02-11 ENCOUNTER — Encounter (HOSPITAL_COMMUNITY)
Admission: RE | Disposition: A | Discharge status: Home / Self Care | Payer: Self-pay | Source: Home / Self Care | Attending: Orthopedic Surgery

## 2022-02-11 DIAGNOSIS — G8918 Other acute postprocedural pain: Secondary | ICD-10-CM

## 2022-02-11 DIAGNOSIS — S83512A Sprain of anterior cruciate ligament of left knee, initial encounter: Secondary | ICD-10-CM | POA: Insufficient documentation

## 2022-02-11 DIAGNOSIS — W19XXXA Unspecified fall, initial encounter: Secondary | ICD-10-CM | POA: Diagnosis not present

## 2022-02-11 DIAGNOSIS — S83282D Other tear of lateral meniscus, current injury, left knee, subsequent encounter: Secondary | ICD-10-CM

## 2022-02-11 DIAGNOSIS — Z01818 Encounter for other preprocedural examination: Secondary | ICD-10-CM

## 2022-02-11 DIAGNOSIS — F17200 Nicotine dependence, unspecified, uncomplicated: Secondary | ICD-10-CM | POA: Diagnosis not present

## 2022-02-11 DIAGNOSIS — S83242A Other tear of medial meniscus, current injury, left knee, initial encounter: Secondary | ICD-10-CM | POA: Diagnosis not present

## 2022-02-11 DIAGNOSIS — Z6841 Body Mass Index (BMI) 40.0 and over, adult: Secondary | ICD-10-CM

## 2022-02-11 DIAGNOSIS — F1290 Cannabis use, unspecified, uncomplicated: Secondary | ICD-10-CM | POA: Diagnosis not present

## 2022-02-11 DIAGNOSIS — S83512D Sprain of anterior cruciate ligament of left knee, subsequent encounter: Secondary | ICD-10-CM

## 2022-02-11 DIAGNOSIS — S83242D Other tear of medial meniscus, current injury, left knee, subsequent encounter: Secondary | ICD-10-CM | POA: Diagnosis not present

## 2022-02-11 DIAGNOSIS — S83282A Other tear of lateral meniscus, current injury, left knee, initial encounter: Secondary | ICD-10-CM | POA: Diagnosis not present

## 2022-02-11 DIAGNOSIS — M25562 Pain in left knee: Secondary | ICD-10-CM | POA: Diagnosis present

## 2022-02-11 HISTORY — PX: BONE MARROW BIOPSY: SHX1253

## 2022-02-11 HISTORY — PX: ANTERIOR CRUCIATE LIGAMENT REPAIR: SHX115

## 2022-02-11 SURGERY — RECONSTRUCTION, KNEE, ACL
Anesthesia: General | Site: Knee | Laterality: Left

## 2022-02-11 MED ORDER — FENTANYL CITRATE (PF) 100 MCG/2ML IJ SOLN
INTRAMUSCULAR | Status: AC
  Administered 2022-02-11: 100 ug via INTRAVENOUS
  Filled 2022-02-11: qty 2

## 2022-02-11 MED ORDER — POVIDONE-IODINE 10 % EX SWAB: 2.0000 | Freq: Once | CUTANEOUS | Status: AC

## 2022-02-11 MED ORDER — SODIUM CHLORIDE 0.9 % IR SOLN
Status: DC | PRN
  Administered 2022-02-11 (×2): 6000 mL

## 2022-02-11 MED ORDER — OXYCODONE HCL 5 MG PO TABS: 5.0000 mg | ORAL_TABLET | Freq: Once | ORAL | Status: DC | PRN

## 2022-02-11 MED ORDER — EPINEPHRINE PF 1 MG/ML IJ SOLN
INTRAMUSCULAR | Status: AC
  Filled 2022-02-11: qty 4

## 2022-02-11 MED ORDER — ORAL CARE MOUTH RINSE: 15.0000 mL | Freq: Once | OROMUCOSAL | Status: AC

## 2022-02-11 MED ORDER — TRANEXAMIC ACID-NACL 1000-0.7 MG/100ML-% IV SOLN
1000.0000 mg | INTRAVENOUS | Status: AC
  Administered 2022-02-11: 1000 mg via INTRAVENOUS
  Filled 2022-02-11: qty 100

## 2022-02-11 MED ORDER — BUPIVACAINE HCL (PF) 0.25 % IJ SOLN
INTRAMUSCULAR | Status: AC
  Filled 2022-02-11: qty 30

## 2022-02-11 MED ORDER — LACTATED RINGERS IV SOLN: INTRAVENOUS | Status: DC

## 2022-02-11 MED ORDER — FENTANYL CITRATE (PF) 100 MCG/2ML IJ SOLN
INTRAMUSCULAR | Status: AC
  Filled 2022-02-11: qty 2

## 2022-02-11 MED ORDER — EPHEDRINE 5 MG/ML INJ
INTRAVENOUS | Status: AC
  Filled 2022-02-11: qty 5

## 2022-02-11 MED ORDER — EPHEDRINE SULFATE (PRESSORS) 50 MG/ML IJ SOLN
INTRAMUSCULAR | Status: DC | PRN
  Administered 2022-02-11 (×2): 5 mg via INTRAVENOUS
  Administered 2022-02-11: 10 mg via INTRAVENOUS
  Administered 2022-02-11: 5 mg via INTRAVENOUS

## 2022-02-11 MED ORDER — ACETAMINOPHEN 160 MG/5ML PO SOLN: 325.0000 mg | ORAL | Status: DC | PRN

## 2022-02-11 MED ORDER — ONDANSETRON HCL 4 MG/2ML IJ SOLN
INTRAMUSCULAR | Status: DC | PRN
  Administered 2022-02-11: 4 mg via INTRAVENOUS

## 2022-02-11 MED ORDER — SODIUM CHLORIDE 0.9 % IR SOLN: Status: DC | PRN

## 2022-02-11 MED ORDER — VANCOMYCIN HCL 1 G IV SOLR
INTRAVENOUS | Status: DC | PRN
  Administered 2022-02-11: 1000 mg

## 2022-02-11 MED ORDER — HEPARIN SODIUM (PORCINE) 1000 UNIT/ML IJ SOLN
INTRAMUSCULAR | Status: AC
  Filled 2022-02-11: qty 1

## 2022-02-11 MED ORDER — DEXAMETHASONE SODIUM PHOSPHATE 10 MG/ML IJ SOLN
INTRAMUSCULAR | Status: DC | PRN
  Administered 2022-02-11: 10 mg via INTRAVENOUS

## 2022-02-11 MED ORDER — FENTANYL CITRATE (PF) 100 MCG/2ML IJ SOLN: 100.0000 ug | Freq: Once | INTRAMUSCULAR | Status: AC

## 2022-02-11 MED ORDER — FENTANYL CITRATE (PF) 250 MCG/5ML IJ SOLN
INTRAMUSCULAR | Status: AC
  Filled 2022-02-11: qty 5

## 2022-02-11 MED ORDER — BUPIVACAINE-EPINEPHRINE (PF) 0.25% -1:200000 IJ SOLN
INTRAMUSCULAR | Status: DC | PRN
  Administered 2022-02-11: 10 mL

## 2022-02-11 MED ORDER — CHLORHEXIDINE GLUCONATE 0.12 % MT SOLN
15.0000 mL | Freq: Once | OROMUCOSAL | Status: AC
  Administered 2022-02-11: 15 mL via OROMUCOSAL
  Filled 2022-02-11: qty 15

## 2022-02-11 MED ORDER — ACETAMINOPHEN 325 MG PO TABS: 325.0000 mg | ORAL_TABLET | ORAL | Status: DC | PRN

## 2022-02-11 MED ORDER — OXYCODONE HCL 5 MG/5ML PO SOLN: 5.0000 mg | Freq: Once | ORAL | Status: DC | PRN

## 2022-02-11 MED ORDER — MORPHINE SULFATE (PF) 4 MG/ML IV SOLN
INTRAVENOUS | Status: AC
  Filled 2022-02-11: qty 2

## 2022-02-11 MED ORDER — CEFAZOLIN SODIUM-DEXTROSE 2-4 GM/100ML-% IV SOLN
2.0000 g | INTRAVENOUS | Status: AC
  Administered 2022-02-11: 2 g via INTRAVENOUS
  Filled 2022-02-11: qty 100

## 2022-02-11 MED ORDER — BUPIVACAINE HCL (PF) 0.25 % IJ SOLN: INTRAMUSCULAR | Status: DC | PRN

## 2022-02-11 MED ORDER — VANCOMYCIN HCL 1000 MG IV SOLR
INTRAVENOUS | Status: AC
  Filled 2022-02-11: qty 20

## 2022-02-11 MED ORDER — MIDAZOLAM HCL 2 MG/2ML IJ SOLN
INTRAMUSCULAR | Status: AC
  Administered 2022-02-11: 2 mg via INTRAVENOUS
  Filled 2022-02-11: qty 2

## 2022-02-11 MED ORDER — HEPARIN SODIUM (PORCINE) 1000 UNIT/ML IJ SOLN
INTRAMUSCULAR | Status: DC | PRN
  Administered 2022-02-11: 30000 [IU]

## 2022-02-11 MED ORDER — PROPOFOL 10 MG/ML IV BOLUS
INTRAVENOUS | Status: DC | PRN
  Administered 2022-02-11: 200 mg via INTRAVENOUS

## 2022-02-11 MED ORDER — METHOCARBAMOL 500 MG PO TABS: 500.0000 mg | ORAL_TABLET | Freq: Three times a day (TID) | ORAL | 1 refills | Status: DC | PRN

## 2022-02-11 MED ORDER — OXYCODONE-ACETAMINOPHEN 5-325 MG PO TABS: 1.0000 | ORAL_TABLET | ORAL | 0 refills | Status: DC | PRN

## 2022-02-11 MED ORDER — CELECOXIB 100 MG PO CAPS: 100.0000 mg | ORAL_CAPSULE | Freq: Two times a day (BID) | ORAL | 0 refills | Status: AC

## 2022-02-11 MED ORDER — LIDOCAINE HCL (CARDIAC) PF 100 MG/5ML IV SOSY
PREFILLED_SYRINGE | INTRAVENOUS | Status: DC | PRN
  Administered 2022-02-11: 60 mg via INTRAVENOUS

## 2022-02-11 MED ORDER — DEXAMETHASONE SODIUM PHOSPHATE 10 MG/ML IJ SOLN
INTRAMUSCULAR | Status: AC
  Filled 2022-02-11: qty 1

## 2022-02-11 MED ORDER — LIDOCAINE 2% (20 MG/ML) 5 ML SYRINGE
INTRAMUSCULAR | Status: AC
  Filled 2022-02-11: qty 5

## 2022-02-11 MED ORDER — KETOROLAC TROMETHAMINE 30 MG/ML IJ SOLN
INTRAMUSCULAR | Status: AC
  Filled 2022-02-11: qty 1

## 2022-02-11 MED ORDER — ASPIRIN 81 MG PO CHEW: 81.0000 mg | CHEWABLE_TABLET | Freq: Two times a day (BID) | ORAL | 0 refills | Status: DC

## 2022-02-11 MED ORDER — ONDANSETRON HCL 4 MG/2ML IJ SOLN: 4.0000 mg | Freq: Once | INTRAMUSCULAR | Status: DC | PRN

## 2022-02-11 MED ORDER — BUPIVACAINE-EPINEPHRINE (PF) 0.25% -1:200000 IJ SOLN
INTRAMUSCULAR | Status: AC
  Filled 2022-02-11: qty 30

## 2022-02-11 MED ORDER — FENTANYL CITRATE (PF) 100 MCG/2ML IJ SOLN
INTRAMUSCULAR | Status: DC | PRN
  Administered 2022-02-11: 50 ug via INTRAVENOUS
  Administered 2022-02-11: 100 ug via INTRAVENOUS
  Administered 2022-02-11 (×2): 50 ug via INTRAVENOUS

## 2022-02-11 MED ORDER — KETOROLAC TROMETHAMINE 30 MG/ML IJ SOLN
30.0000 mg | Freq: Once | INTRAMUSCULAR | Status: AC | PRN
  Administered 2022-02-11: 30 mg via INTRAVENOUS

## 2022-02-11 MED ORDER — CLONIDINE HCL (ANALGESIA) 100 MCG/ML EP SOLN
EPIDURAL | Status: AC
  Filled 2022-02-11: qty 10

## 2022-02-11 MED ORDER — FENTANYL CITRATE (PF) 100 MCG/2ML IJ SOLN
25.0000 ug | INTRAMUSCULAR | Status: DC | PRN
  Administered 2022-02-11 (×2): 50 ug via INTRAVENOUS

## 2022-02-11 MED ORDER — ONDANSETRON HCL 4 MG/2ML IJ SOLN
INTRAMUSCULAR | Status: AC
  Filled 2022-02-11: qty 2

## 2022-02-11 MED ORDER — MEPERIDINE HCL 25 MG/ML IJ SOLN: 6.2500 mg | INTRAMUSCULAR | Status: DC | PRN

## 2022-02-11 MED ORDER — MORPHINE SULFATE (PF) 4 MG/ML IV SOLN
INTRAVENOUS | Status: DC | PRN
  Administered 2022-02-11: 33 mL

## 2022-02-11 MED ORDER — SODIUM CHLORIDE (PF) 0.9 % IJ SOLN
INTRAMUSCULAR | Status: DC | PRN
  Administered 2022-02-11: 5 mL

## 2022-02-11 MED ORDER — MIDAZOLAM HCL 2 MG/2ML IJ SOLN: 2.0000 mg | Freq: Once | INTRAMUSCULAR | Status: AC

## 2022-02-11 MED ORDER — POVIDONE-IODINE 7.5 % EX SOLN
Freq: Once | CUTANEOUS | Status: DC
  Filled 2022-02-11: qty 118

## 2022-02-11 MED ORDER — 0.9 % SODIUM CHLORIDE (POUR BTL) OPTIME
TOPICAL | Status: DC | PRN
  Administered 2022-02-11: 6000 mL

## 2022-02-11 MED ORDER — ROPIVACAINE HCL 5 MG/ML IJ SOLN
INTRAMUSCULAR | Status: DC | PRN
  Administered 2022-02-11 (×6): 5 mL via PERINEURAL

## 2022-02-11 MED ORDER — CLONIDINE HCL (ANALGESIA) 100 MCG/ML EP SOLN
EPIDURAL | Status: DC | PRN
  Administered 2022-02-11: 100 ug

## 2022-02-11 MED ORDER — SODIUM CHLORIDE 0.9 % IR SOLN
Status: DC | PRN
  Administered 2022-02-11: 3000 mL

## 2022-02-11 MED ORDER — POVIDONE-IODINE 10 % EX SWAB
2.0000 | Freq: Once | CUTANEOUS | Status: AC
  Administered 2022-02-11: 2 via TOPICAL

## 2022-02-11 MED ORDER — ACD FORMULA A 0.73-2.45-2.2 GM/100ML VI SOLN
Status: AC | PRN
  Administered 2022-02-11: 30 mL

## 2022-02-11 SURGICAL SUPPLY — 104 items
ANCHOR AUTOGRAFT GRAFTLINK CP2 (Anchor) IMPLANT
BAG COUNTER SPONGE SURGICOUNT (BAG) IMPLANT
BAG SPNG CNTER NS LX DISP (BAG)
BANDAGE ESMARK 6X9 LF (GAUZE/BANDAGES/DRESSINGS) ×2 IMPLANT
BLADE EXCALIBUR 4.0X13 (MISCELLANEOUS) ×2 IMPLANT
BLADE SHAVER TORPEDO 4X13 (MISCELLANEOUS) IMPLANT
BLADE SURG 10 STRL SS (BLADE) ×2 IMPLANT
BLADE SURG 15 STRL LF DISP TIS (BLADE) ×4 IMPLANT
BLADE SURG 15 STRL SS (BLADE) ×4
BNDG CMPR 9X6 STRL LF SNTH (GAUZE/BANDAGES/DRESSINGS) ×2
BNDG CMPR MED 10X6 ELC LF (GAUZE/BANDAGES/DRESSINGS) ×2
BNDG CMPR MED 15X6 ELC VLCR LF (GAUZE/BANDAGES/DRESSINGS) ×2
BNDG ELASTIC 6X10 VLCR STRL LF (GAUZE/BANDAGES/DRESSINGS) IMPLANT
BNDG ELASTIC 6X15 VLCR STRL LF (GAUZE/BANDAGES/DRESSINGS) ×2 IMPLANT
BNDG ESMARK 6X9 LF (GAUZE/BANDAGES/DRESSINGS) ×2
BURR OVAL 8 FLU 4.0X13 (MISCELLANEOUS) ×2 IMPLANT
CANN ZONE NAVIGATOR LT DISP (ORTHOPEDIC DISPOSABLE SUPPLIES) ×2
CANNULA ZONE NAVIGATOR LT DISP (ORTHOPEDIC DISPOSABLE SUPPLIES) IMPLANT
COVER MAYO STAND STRL (DRAPES) ×2 IMPLANT
COVER SURGICAL LIGHT HANDLE (MISCELLANEOUS) ×2 IMPLANT
CUFF TOURN SGL QUICK 34 (TOURNIQUET CUFF)
CUFF TOURN SGL QUICK 42 (TOURNIQUET CUFF) IMPLANT
CUFF TRNQT CYL 34X4.125X (TOURNIQUET CUFF) IMPLANT
CUTTER BONE 4.0MM X 13CM (MISCELLANEOUS) ×2 IMPLANT
DRAPE ARTHROSCOPY W/POUCH 114 (DRAPES) ×2 IMPLANT
DRAPE INCISE IOBAN 66X45 STRL (DRAPES) ×2 IMPLANT
DRAPE OEC MINIVIEW 54X84 (DRAPES) IMPLANT
DRAPE ORTHO SPLIT 77X108 STRL (DRAPES) ×2
DRAPE SURG ORHT 6 SPLT 77X108 (DRAPES) ×2 IMPLANT
DRAPE U-SHAPE 47X51 STRL (DRAPES) ×2 IMPLANT
DRSG TEGADERM 2-3/8X2-3/4 SM (GAUZE/BANDAGES/DRESSINGS) IMPLANT
DRSG TEGADERM 4X4.75 (GAUZE/BANDAGES/DRESSINGS) IMPLANT
DRSG XEROFORM 1X8 (GAUZE/BANDAGES/DRESSINGS) IMPLANT
DW OUTFLOW CASSETTE/TUBE SET (MISCELLANEOUS) ×2 IMPLANT
ELECT REM PT RETURN 9FT ADLT (ELECTROSURGICAL) ×2
ELECTRODE REM PT RTRN 9FT ADLT (ELECTROSURGICAL) ×2 IMPLANT
GAUZE PAD ABD 8X10 STRL (GAUZE/BANDAGES/DRESSINGS) ×6 IMPLANT
GAUZE SPONGE 4X4 12PLY STRL (GAUZE/BANDAGES/DRESSINGS) ×4 IMPLANT
GAUZE XEROFORM 1X8 LF (GAUZE/BANDAGES/DRESSINGS) ×2 IMPLANT
GLOVE BIO SURGEON STRL SZ7 (GLOVE) ×4 IMPLANT
GLOVE BIOGEL PI IND STRL 8 (GLOVE) ×2 IMPLANT
GLOVE ECLIPSE 8.0 STRL XLNG CF (GLOVE) ×2 IMPLANT
GLOVE INDICATOR 7.0 STRL GRN (GLOVE) ×2 IMPLANT
GOWN STRL REUS W/ TWL LRG LVL3 (GOWN DISPOSABLE) ×6 IMPLANT
GOWN STRL REUS W/ TWL XL LVL3 (GOWN DISPOSABLE) ×2 IMPLANT
GOWN STRL REUS W/TWL LRG LVL3 (GOWN DISPOSABLE) ×6
GOWN STRL REUS W/TWL XL LVL3 (GOWN DISPOSABLE) ×2
IMMOBILIZER KNEE 22 UNIV (SOFTGOODS) IMPLANT
IMP SYS 2ND FIX PEEK 4.75X19.1 (Miscellaneous) ×2 IMPLANT
IMPL SYS 2ND FX PEEK 4.75X19.1 (Miscellaneous) IMPLANT
KIT BASIN OR (CUSTOM PROCEDURE TRAY) ×2 IMPLANT
KIT BIOCARTILAGE LG JOINT MIX (KITS) ×2 IMPLANT
KIT BONE MRW ASP ANGEL CPRP (KITS) IMPLANT
KIT BUTTON TIGHTROPE ABS 8X12 (Anchor) IMPLANT
KIT TURNOVER KIT B (KITS) ×2 IMPLANT
MANIFOLD NEPTUNE II (INSTRUMENTS) ×2 IMPLANT
MARKER SKIN DUAL TIP RULER LAB (MISCELLANEOUS) ×2 IMPLANT
MID POST LF CANNULA AR7910L (ORTHOPEDIC DISPOSABLE SUPPLIES) ×2
NDL 18GX1X1/2 (RX/OR ONLY) (NEEDLE) ×2 IMPLANT
NEEDLE 18GX1X1/2 (RX/OR ONLY) (NEEDLE) ×2 IMPLANT
NS IRRIG 1000ML POUR BTL (IV SOLUTION) ×2 IMPLANT
PACK ARTHROSCOPY DSU (CUSTOM PROCEDURE TRAY) ×2 IMPLANT
PAD ARMBOARD 7.5X6 YLW CONV (MISCELLANEOUS) ×4 IMPLANT
PAD CAST 4YDX4 CTTN HI CHSV (CAST SUPPLIES) ×2 IMPLANT
PAD COLD SHLDR WRAP-ON (PAD) ×2 IMPLANT
PADDING CAST COTTON 4X4 STRL (CAST SUPPLIES) ×2
PADDING CAST COTTON 6X4 STRL (CAST SUPPLIES) ×6 IMPLANT
PENCIL BUTTON HOLSTER BLD 10FT (ELECTRODE) IMPLANT
PUTTY DBM ALLOSYNC PURE 5CC (Putty) IMPLANT
SPIKE FLUID TRANSFER (MISCELLANEOUS) ×2 IMPLANT
SPONGE T-LAP 4X18 ~~LOC~~+RFID (SPONGE) ×4 IMPLANT
STRIP CLOSURE SKIN 1/2X4 (GAUZE/BANDAGES/DRESSINGS) IMPLANT
SUCTION FRAZIER HANDLE 10FR (MISCELLANEOUS) ×2
SUCTION TUBE FRAZIER 10FR DISP (MISCELLANEOUS) ×2 IMPLANT
SUT 0 FIBERLOOP 38 BLUE TPR ND (SUTURE) ×6
SUT 2 FIBERLOOP 20 STRT BLUE (SUTURE)
SUT ETHILON 3 0 PS 1 (SUTURE) ×8 IMPLANT
SUT MNCRL AB 3-0 PS2 18 (SUTURE) ×4 IMPLANT
SUT MNCRL AB 4-0 PS2 18 (SUTURE) ×4 IMPLANT
SUT VIC AB 0 CT1 27 (SUTURE) ×8
SUT VIC AB 0 CT1 27XBRD ANBCTR (SUTURE) ×4 IMPLANT
SUT VIC AB 1 CT1 27 (SUTURE) ×6
SUT VIC AB 1 CT1 27XBRD ANTBC (SUTURE) ×6 IMPLANT
SUT VIC AB 2-0 CT1 27 (SUTURE) ×8
SUT VIC AB 2-0 CT1 TAPERPNT 27 (SUTURE) ×4 IMPLANT
SUT VIC AB 3-0 CT1 27 (SUTURE) ×4
SUT VIC AB 3-0 CT1 TAPERPNT 27 (SUTURE) ×4 IMPLANT
SUT VICRYL 0 AB UR-6 (SUTURE) ×6 IMPLANT
SUT VICRYL 0 UR6 27IN ABS (SUTURE) ×2 IMPLANT
SUTURE 0 FIBERLP 38 BLU TPR ND (SUTURE) IMPLANT
SUTURE 2 FIBERLOOP 20 STRT BLU (SUTURE) IMPLANT
SUTURE TAPE 2-0 MENISCS NDL (SUTURE) IMPLANT
SUTURE TAPE TIGERLINK 1.3MM BL (SUTURE) ×2 IMPLANT
SUTURETAPE 2-0 MENISCS NDL (SUTURE) ×10
SUTURETAPE TIGERLINK 1.3MM BL (SUTURE) ×2
SYR 30ML LL (SYRINGE) ×2 IMPLANT
SYR BULB IRRIG 60ML STRL (SYRINGE) ×2 IMPLANT
SYR TB 1ML LUER SLIP (SYRINGE) ×2 IMPLANT
TAPE SUT LABRALTAP WHT/BLK (SUTURE) ×2 IMPLANT
TOWEL GREEN STERILE (TOWEL DISPOSABLE) ×4 IMPLANT
TOWEL GREEN STERILE FF (TOWEL DISPOSABLE) ×2 IMPLANT
TUBING ARTHROSCOPY IRRIG 16FT (MISCELLANEOUS) ×2 IMPLANT
UNDERPAD 30X36 HEAVY ABSORB (UNDERPADS AND DIAPERS) ×2 IMPLANT
YANKAUER SUCT BULB TIP NO VENT (SUCTIONS) ×2 IMPLANT

## 2022-02-11 NOTE — H&P (Signed)
Cassandra Galloway is an 31 y.o. female.   Chief Complaint: left knee pain HPI: Patient presents for evaluation of her left knee.  Date of injury 11/16/2021.  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  She fell and her knee popped.  Has been taking ibuprofen which helps some.  Does not really currently report any pain in the left knee but does report some occasional symptomatic instability.  She feels like her knee hyperextends at times.  She has been using a brace early on after the injury but recently over the past several weeks has not used a brace and in general is able to get around.  Reports some swelling last week.  She does predominantly sitdown work.  No personal or family history of DVT or pulmonary embolism.  She does not do much in the way of sporting activities and does not do much in the way of cutting or pivoting activities  Past Medical History:  Diagnosis Date   Anemia     History reviewed. No pertinent surgical history.  History reviewed. No pertinent family history. Social History:  reports that she has been smoking. She has never used smokeless tobacco. She reports current alcohol use. She reports current drug use. Drug: Marijuana.  Allergies: No Known Allergies  Medications Prior to Admission  Medication Sig Dispense Refill   ibuprofen (ADVIL) 800 MG tablet Take 1 tablet (800 mg total) by mouth 3 (three) times daily. (Patient taking differently: Take 800 mg by mouth every 8 (eight) hours as needed for moderate pain.) 21 tablet 0   acetaminophen-codeine (TYLENOL #4) 300-60 MG tablet Take 1 tablet by mouth every 6 (six) hours as needed for moderate pain. (Patient not taking: Reported on 01/30/2022) 50 tablet 0   metroNIDAZOLE (FLAGYL) 500 MG tablet Take all 4 tablets at 1 time. (Patient not taking: Reported on 01/30/2022) 4 tablet 0    No results found for this or any previous visit (from the past 48 hour(s)). No results found.  Review of  Systems  Musculoskeletal:  Positive for arthralgias.  All other systems reviewed and are negative.   Blood pressure 129/72, pulse (!) 58, temperature 98.7 F (37.1 C), temperature source Oral, resp. rate 15, height 5\' 2"  (1.575 m), weight 104.3 kg, last menstrual period 02/06/2022, SpO2 100 %. Physical Exam Vitals reviewed.  HENT:     Head: Normocephalic.     Nose: Nose normal.     Mouth/Throat:     Mouth: Mucous membranes are moist.  Eyes:     Pupils: Pupils are equal, round, and reactive to light.  Cardiovascular:     Rate and Rhythm: Normal rate.     Pulses: Normal pulses.  Pulmonary:     Effort: Pulmonary effort is normal.  Abdominal:     General: Abdomen is flat.  Musculoskeletal:     Cervical back: Normal range of motion.  Skin:    General: Skin is warm.     Capillary Refill: Capillary refill takes less than 2 seconds.  Neurological:     General: No focal deficit present.     Mental Status: She is alert.  Psychiatric:        Mood and Affect: Mood normal.     Ortho exam demonstrates normal gait and alignment.  Left knee has increased Lachman and anterior drawer compared to the right.  There is no posterolateral rotatory instability.  Collaterals are stable to varus stress at 0 and 30 degrees.  No  masses lymphadenopathy or skin changes noted in the left knee region.  Range of motion is about 5 degrees of hyperextension bilaterally to full flexion of 125.  No effusion is present. Assessment/Plan  Impression is left knee ACL tear with medial meniscal tear.  The medial meniscal tear is a vertical tear which should be amenable to repair.  The risk and benefits of surgery are discussed with the patient including not limited to infection nerve vessel damage knee stiffness as well as potential need for further surgery.  The patient's main goal is to be able to get back into the gym so she can lose weight.  We talked about autograft and allograft options.  I think her best option for  quickest recovery with stable knee would be autograft hamstring ACL reconstruction with inside-out medial meniscal repair.  The risk and benefits of the procedure are discussed with the patient including not limited to infection nerve vessel damage knee stiffness and all the other complications listed above.  Patient understands the risk and benefits and would like to proceed.  All questions answered.  I do think that based on her short stature that quad autograft is not a great option.  For her level of activity a quadrupled hamstring and bacillus autograft of the appropriate length should be good for stability.  Would aim for minimum 9 mm graft.  Anderson Malta, MD 02/11/2022, 11:03 AM

## 2022-02-11 NOTE — Transfer of Care (Signed)
Immediate Anesthesia Transfer of Care Note  Patient: Cassandra Galloway  Procedure(s) Performed: LEFT KNEE ACL RECONSTRUCTION WITH HAMSTRING AUTOGRAFT, MEDIAL MENISCAL REPAIR (Left: Knee) BONE MARROW ASPIRATE  Patient Location: PACU  Anesthesia Type:General  Level of Consciousness: awake, alert , and drowsy  Airway & Oxygen Therapy: Patient Spontanous Breathing and Patient connected to face mask oxygen  Post-op Assessment: Report given to RN and Post -op Vital signs reviewed and stable  Post vital signs: Reviewed and stable  Last Vitals:  Vitals Value Taken Time  BP 122/74 02/11/22 1536  Temp    Pulse 71 02/11/22 1540  Resp 17 02/11/22 1540  SpO2 100 % 02/11/22 1540  Vitals shown include unvalidated device data.  Last Pain:  Vitals:   02/11/22 1021  TempSrc:   PainSc: 0-No pain         Complications: No notable events documented.

## 2022-02-11 NOTE — Anesthesia Procedure Notes (Signed)
Procedure Name: Intubation Date/Time: 02/11/2022 12:01 PM  Performed by: Jonna Munro, CRNAPre-anesthesia Checklist: Patient identified, Emergency Drugs available, Suction available, Patient being monitored and Timeout performed Patient Re-evaluated:Patient Re-evaluated prior to induction Oxygen Delivery Method: Circle system utilized Preoxygenation: Pre-oxygenation with 100% oxygen Induction Type: IV induction LMA: LMA inserted LMA Size: 4.0 Number of attempts: 1 Placement Confirmation: positive ETCO2, CO2 detector and breath sounds checked- equal and bilateral Tube secured with: Tape Dental Injury: Teeth and Oropharynx as per pre-operative assessment

## 2022-02-11 NOTE — Anesthesia Preprocedure Evaluation (Signed)
Anesthesia Evaluation  Patient identified by MRN, date of birth, ID band Patient awake    Reviewed: Allergy & Precautions, NPO status , Patient's Chart, lab work & pertinent test results  Airway Mallampati: II       Dental no notable dental hx.    Pulmonary Current Smoker and Patient abstained from smoking.,    Pulmonary exam normal        Cardiovascular negative cardio ROS Normal cardiovascular exam     Neuro/Psych negative neurological ROS  negative psych ROS   GI/Hepatic negative GI ROS, Neg liver ROS,   Endo/Other  Morbid obesity  Renal/GU negative Renal ROS  negative genitourinary   Musculoskeletal   Abdominal (+) + obese,   Peds  Hematology   Anesthesia Other Findings   Reproductive/Obstetrics                             Anesthesia Physical Anesthesia Plan  ASA: 3  Anesthesia Plan: General   Post-op Pain Management: Regional block*   Induction: Intravenous  PONV Risk Score and Plan: 2 and Ondansetron, Dexamethasone and Midazolam  Airway Management Planned: LMA  Additional Equipment: None  Intra-op Plan:   Post-operative Plan: Extubation in OR  Informed Consent: I have reviewed the patients History and Physical, chart, labs and discussed the procedure including the risks, benefits and alternatives for the proposed anesthesia with the patient or authorized representative who has indicated his/her understanding and acceptance.     Dental advisory given  Plan Discussed with: CRNA  Anesthesia Plan Comments:         Anesthesia Quick Evaluation

## 2022-02-11 NOTE — Brief Op Note (Signed)
   02/11/2022  4:45 PM  PATIENT:  Arbutus Leas  31 y.o. female  PRE-OPERATIVE DIAGNOSIS:  left knee anterior cruciate ligament tear, medial meniscal tear  POST-OPERATIVE DIAGNOSIS:  left knee anterior cruciate ligament tear, medial meniscal tear,lateral meniscal tear  PROCEDURE:  Procedure(s): LEFT KNEE ACL RECONSTRUCTION WITH HAMSTRING AUTOGRAFT, MEDIAL MENISCAL REPAIR BONE MARROW ASPIRATE,partial lateral menisectomy  SURGEON:  Surgeon(s): Marlou Sa, Tonna Corner, MD Callie Fielding, MD  ASSISTANT: magnant pa  ANESTHESIA:   general  EBL: 25 ml    Total I/O In: 2000 [I.V.:1800; IV Piggyback:200] Out: 50 [Blood:50]  BLOOD ADMINISTERED: none  DRAINS: none   LOCAL MEDICATIONS USED:    SPECIMEN:  No Specimen  COUNTS:  YES  TOURNIQUET:   Total Tourniquet Time Documented: Thigh (Left) - 50 minutes Total: Thigh (Left) - 50 minutes   DICTATION: .Other Dictation: Dictation Number [id number]  PLAN OF CARE: Discharge to home after PACU  PATIENT DISPOSITION:  PACU - hemodynamically stable

## 2022-02-11 NOTE — Anesthesia Procedure Notes (Signed)
Anesthesia Regional Block: Adductor canal block   Pre-Anesthetic Checklist: , timeout performed,  Correct Patient, Correct Site, Correct Laterality,  Correct Procedure, Correct Position, site marked,  Risks and benefits discussed,  Surgical consent,  Pre-op evaluation,  At surgeon's request and post-op pain management  Laterality: Lower and Left  Prep: chloraprep       Needles:  Injection technique: Single-shot  Needle Type: Echogenic Stimulator Needle     Needle Length: 9cm  Needle Gauge: 20   Needle insertion depth: 3 cm   Additional Needles:   Procedures:,,,, ultrasound used (permanent image in chart),,    Narrative:  Start time: 02/11/2022 11:25 AM End time: 02/11/2022 11:35 AM Injection made incrementally with aspirations every 5 mL.  Performed by: Personally  Anesthesiologist: Lyn Hollingshead, MD

## 2022-02-11 NOTE — Op Note (Signed)
NAME: Cassandra Galloway, MERVIN MEDICAL RECORD NO: [id number] ACCOUNT NO: [id number] DATE OF BIRTH: 07/18/1990 FACILITY: [facility] LOCATION: [facility] PHYSICIAN: Graylin Shiver. August Saucer, MD  Operative Report   DATE OF PROCEDURE: 02/11/2022  PREOPERATIVE DIAGNOSIS:  Left knee anterior cruciate ligament tear, medial meniscal tear.  POSTOPERATIVE DIAGNOSIS:  Left knee anterior cruciate ligament tear with medial meniscal tear and lateral meniscal tear.  PROCEDURE:  Left knee arthroscopy with ACL reconstruction using hamstring autograft and medial meniscal repair and partial lateral meniscectomy.  SURGEON:  Graylin Shiver. August Saucer, MD  ASSISTANT:  Willia Craze, MD  INDICATIONS:  This is a 31 year old female with left knee pain, who presents for operative management after explanation of risks and benefits.  DESCRIPTION OF PROCEDURE:  The patient was brought to the operating room where general anesthetic was induced.  Preoperative antibiotics were administered.  Timeout was called.  Left leg was pre-scrubbed with alcohol, Betadine, allowed to air dry,  prepped with DuraPrep solution and draped in sterile manner.  Left hip region also prepped with DuraPrep solution and draped in sterile manner.  Examination under anesthesia of the left knee demonstrates about 5-10 degrees of hyperextension with good  flexion, stable collateral ligaments at 0 and 30 degrees to varus and valgus stress with no posterolateral rotatory instability.  The patient did have ACL laxity with positive anterior drawer, positive Lachman. After prepping and draping of the left leg  and hip Ioban was used to cover the operative field on the left hand side.  Timeout was called.  Left iliac crest aspirate was performed about 3 fingerbreadths proximal to anterior superior iliac crest. Small incision was made and the trocar was placed  between the inner and outer tables.  Bone marrow aspirate was obtained and spun down to obtain bone marrow aspirate concentrate.   This incision was irrigated and closed using 3-0 nylon suture and impervious dressing placed.  Attention then directed  towards the knee.  Based on preoperative examination under anesthesia, incision was made over the pes bursa.  Skin and subcutaneous tissue were sharply divided.  The semitendinosus and gracilis tendons were then harvested with care being taken to avoid  injury to the saphenous nerve.  After harvesting these tendons they were prepared on the back table by [facility] using dual Endobutton technique to a size of 9 mm graft on the femoral side, 8.5 mm on the tibial side.  After graft preparation, the graft was  soaked with a vancomycin-soaked sponge.  Next confirmed with this anterior inferolateral and anterior inferomedial portals were established.  Diagnostic arthroscopy was performed.  The patient had torn ACL.  Notchplasty performed.  ACL stump debrided.   Next, the patient also had a lateral meniscal tear, which was radial in nature involving about 20-30% of the anterior and posterior width of the meniscus and this was debrided back to a stable rim with the combination of basket punch and shaver.  The  patient also had a medial meniscal tear, which was at the meniscal capsular junction.  In accordance with the preoperative MRI scanning.  This area was prepared using a ball rasp.  Then, a separate incision was made on the posteromedial aspect of the  knee at the level of the joint line.  Skin and subcutaneous tissue sharply divided.  Care was taken to avoid injury to the saphenous nerve and branches.  Next, using inside-out technique 5 vertical mattress sutures were placed beginning at the anterior  margin of the tear and extending distally.  These were  tied with the knee in about 30 degrees of flexion with very good repair obtained.  This incision was thoroughly irrigated.  Tourniquet was released at this time and it had been put up for the  meniscal repair portion of the case for approximately  1 hour.  Next, attention was redirected towards the ACL reconstruction.  The femoral and tibial tunnels were drilled using a FlipCutter and under fluoroscopic guidance, the button was flipped on the  femur and the graft was passed.  Bone graft was placed into the tunnels.  This was done before passing the graft. Next graft was passed and secured on the tibial side using an Endobutton with the knee in extension.  Supplemental fixation was performed  with SwiveLocks.  Internal brace was also applied.  Next, the thorough irrigation was performed of the knee joint and all incisions.  Knee had excellent stability.  A thorough irrigation of all the incisions as well as the knee joint was performed.   Portals were closed using 2-0 Vicryl, 3-0 nylon.  The other two incisions for the meniscal repair and graft harvest were closed using 0 Vicryl suture, 2-0 Vicryl suture, and 3-0 Monocryl with Steri-Strips.  Impervious dressings applied.  Solution of  Marcaine, morphine, clonidine injected into the knee for postoperative pain relief.  Iceman plus knee immobilizer placed.  Dr. Tawanna Sat assistance was required at all times during the case for opening, closing, retraction, drilling mobilization of tissue.   His assistance was a medical necessity.   PUS D: 02/11/2022 4:52:18 pm T: 02/11/2022 9:28:00 pm  JOB: [id number]/ [id number]

## 2022-02-12 ENCOUNTER — Telehealth: Payer: Self-pay | Admitting: Orthopedic Surgery

## 2022-02-12 ENCOUNTER — Encounter (HOSPITAL_COMMUNITY): Payer: Self-pay | Admitting: Orthopedic Surgery

## 2022-02-12 NOTE — Telephone Encounter (Signed)
Pt called requesting a call back about her CPM machine. She had surgery yesterday and was informed to call about the machine. Please call pt about this matter at [phone].

## 2022-02-12 NOTE — Telephone Encounter (Signed)
Please advise 

## 2022-02-17 NOTE — Anesthesia Postprocedure Evaluation (Signed)
Anesthesia Post Note  Patient: Cassandra Galloway  Procedure(s) Performed: LEFT KNEE ACL RECONSTRUCTION WITH HAMSTRING AUTOGRAFT, MEDIAL MENISCAL REPAIR (Left: Knee) BONE MARROW ASPIRATE     Patient location during evaluation: PACU Anesthesia Type: General Level of consciousness: awake and sedated Pain management: pain level controlled Vital Signs Assessment: post-procedure vital signs reviewed and stable Respiratory status: spontaneous breathing Cardiovascular status: stable Postop Assessment: no apparent nausea or vomiting Anesthetic complications: no   No notable events documented.  Last Vitals:  Vitals:   02/11/22 1700 02/11/22 1709  BP: (!) 107/56 110/65  Pulse: (!) 57 (!) 55  Resp: 17 17  Temp:  37.1 C  SpO2: 98% 99%    Last Pain:  Vitals:   02/11/22 1615  TempSrc:   PainSc: 10-Worst pain ever                 Huston Foley

## 2022-02-19 ENCOUNTER — Encounter: Payer: Self-pay | Admitting: Orthopedic Surgery

## 2022-02-19 ENCOUNTER — Ambulatory Visit (INDEPENDENT_AMBULATORY_CARE_PROVIDER_SITE_OTHER): Payer: 59 | Admitting: Surgical

## 2022-02-19 DIAGNOSIS — Z9889 Other specified postprocedural states: Secondary | ICD-10-CM

## 2022-02-19 MED ORDER — ASPIRIN 81 MG PO CHEW: 81.0000 mg | CHEWABLE_TABLET | Freq: Two times a day (BID) | ORAL | 0 refills | Status: AC

## 2022-02-19 NOTE — Progress Notes (Signed)
Post-Op Visit Note   Patient: Cassandra Galloway           Date of Birth: [birth year]           MRN: [id number] Visit Date: 02/19/2022 PCP: Ladell Pier, MD   Assessment & Plan:  Chief Complaint:  Chief Complaint  Patient presents with   Left Knee - Routine Post Op         Surgery 02/11/22 (8d)  Left Knee Acl Reconstruction With Hamstring Autograft, Medial Meniscal Repair - Left   Bone Marrow Aspirate      Visit Diagnoses:  1. S/P ACL reconstruction   2. S/P medial meniscal repair     Plan: Patient is a 31 year old female who presents s/p left knee anterior cruciate ligament reconstruction with hamstring autograft and inside-out medial meniscal repair with lateral meniscal debridement.  She is about a week postop.  Doing well overall but does note constant pain and some numbness in the medial aspect of the knee in the saphenous nerve distribution.  Denies any chest pain, shortness of breath, calf pain, abdominal pain, fevers, chills.  She has been trying to maintain nonweightbearing status is much as possible but she does have to occasionally put a partial amount of weightbearing pressure on her operative leg though she tries to keep the leg in full extension when she does this.  Lives on the second floor of an apartment building.  On exam, patient has 0 degrees extension and 80 degrees of knee flexion passively.  She can lift her leg up without extensor lag.  She has incisions that are healing well without evidence of infection or dehiscence.  Sutures intact and these were removed and replaced with Steri-Strips today.  She has no effusion.  ACL graft is stable on Lachman exam.  No calf tenderness.  Negative Homans' sign.  Plan is to continue with nonweightbearing status as best that she can manage.  If she puts any amount of weight on the leg, make sure she weightbears in full extension.  Follow-up in 2 weeks for clinical recheck primarily regarding her range of  motion.  Follow-up with Dr. Marlou Sa.  She was not taking aspirin as prescribed and so she was counseled to take aspirin as directed to prevent risk of DVT/PE.  This was represcribed today.  Follow-Up Instructions: No follow-ups on file.   Orders:  No orders of the defined types were placed in this encounter.  Meds ordered this encounter  Medications   aspirin (ASPIRIN CHILDRENS) 81 MG chewable tablet    Sig: Chew 1 tablet (81 mg total) by mouth 2 (two) times daily. To prevent blood clots    Dispense:  60 tablet    Refill:  0    Imaging: No results found.  PMFS History: There are no problems to display for this patient.  Past Medical History:  Diagnosis Date   Anemia     No family history on file.  Past Surgical History:  Procedure Laterality Date   ANTERIOR CRUCIATE LIGAMENT REPAIR Left 02/11/2022   Procedure: LEFT KNEE ACL RECONSTRUCTION WITH HAMSTRING AUTOGRAFT, MEDIAL MENISCAL REPAIR;  Surgeon: Meredith Pel, MD;  Location: [facility];  Service: Orthopedics;  Laterality: Left;   BONE MARROW BIOPSY  02/11/2022   Procedure: BONE MARROW ASPIRATE;  Surgeon: Meredith Pel, MD;  Location: [facility] OR;  Service: Orthopedics;;   Social History   Occupational History   Not on file  Tobacco Use   Smoking status: Some Days  Smokeless tobacco: Never  Vaping Use   Vaping Use: Never used  Substance and Sexual Activity   Alcohol use: Yes    Comment: occasionally   Drug use: Yes    Types: Marijuana    Comment: every other weekend   Sexual activity: Yes    Birth control/protection: None

## 2022-03-02 DIAGNOSIS — S83282A Other tear of lateral meniscus, current injury, left knee, initial encounter: Secondary | ICD-10-CM

## 2022-03-02 DIAGNOSIS — S83512A Sprain of anterior cruciate ligament of left knee, initial encounter: Secondary | ICD-10-CM

## 2022-03-02 DIAGNOSIS — S83242A Other tear of medial meniscus, current injury, left knee, initial encounter: Secondary | ICD-10-CM

## 2022-03-05 ENCOUNTER — Ambulatory Visit (INDEPENDENT_AMBULATORY_CARE_PROVIDER_SITE_OTHER): Payer: 59 | Admitting: Orthopedic Surgery

## 2022-03-05 ENCOUNTER — Encounter: Payer: Self-pay | Admitting: Orthopedic Surgery

## 2022-03-05 DIAGNOSIS — Z9889 Other specified postprocedural states: Secondary | ICD-10-CM

## 2022-03-05 NOTE — Progress Notes (Signed)
   Post-Op Visit Note   Patient: Cassandra Galloway           Date of Birth: [birth year]           MRN: [id number] Visit Date: 03/05/2022 PCP: Ladell Pier, MD   Assessment & Plan:  Chief Complaint:  Chief Complaint  Patient presents with   Left Knee - Routine Post Op    02/11/22 (3w 1d) Left Knee Acl Reconstruction With Hamstring Autograft, Medial Meniscal Repair Bone Marrow Aspirate      Visit Diagnoses: No diagnosis found.  Plan: Patient is now about 3 weeks out left knee ACL reconstruction with hamstring autograft along with medial meniscal repair and being active.  She is ambulating with a walker.  On examination she still reporting some numbness in the medial aspect of her lower leg.  No calf tenderness negative Homans.  Range of motion 0-90 easily and she is able to straight leg raise.  Graft is stable.  Not taking any pain meds because they cause her to break out.  Plan at this time is to start weightbearing next week with physical therapy.  Okay to return to work December 4.  She needs about a month or slightly longer to get back to weightbearing.  Scar tissue massage for desensitization.  Follow-up in 5 weeks for clinical recheck.  Follow-Up Instructions: No follow-ups on file.   Orders:  No orders of the defined types were placed in this encounter.  No orders of the defined types were placed in this encounter.   Imaging: No results found.  PMFS History: Patient Active Problem List   Diagnosis Date Noted   Left anterior cruciate ligament tear    Acute lateral meniscus tear of left knee    Acute medial meniscus tear of left knee    Past Medical History:  Diagnosis Date   Anemia     No family history on file.  Past Surgical History:  Procedure Laterality Date   ANTERIOR CRUCIATE LIGAMENT REPAIR Left 02/11/2022   Procedure: LEFT KNEE ACL RECONSTRUCTION WITH HAMSTRING AUTOGRAFT, MEDIAL MENISCAL REPAIR;  Surgeon: Meredith Pel, MD;   Location: [place];  Service: Orthopedics;  Laterality: Left;   BONE MARROW BIOPSY  02/11/2022   Procedure: BONE MARROW ASPIRATE;  Surgeon: Meredith Pel, MD;  Location: [facility] OR;  Service: Orthopedics;;   Social History   Occupational History   Not on file  Tobacco Use   Smoking status: Some Days   Smokeless tobacco: Never  Vaping Use   Vaping Use: Never used  Substance and Sexual Activity   Alcohol use: Yes    Comment: occasionally   Drug use: Yes    Types: Marijuana    Comment: every other weekend   Sexual activity: Yes    Birth control/protection: None

## 2022-03-11 ENCOUNTER — Ambulatory Visit (INDEPENDENT_AMBULATORY_CARE_PROVIDER_SITE_OTHER): Payer: 59 | Admitting: Physical Therapy

## 2022-03-11 ENCOUNTER — Encounter: Payer: Self-pay | Admitting: Physical Therapy

## 2022-03-11 DIAGNOSIS — M25662 Stiffness of left knee, not elsewhere classified: Secondary | ICD-10-CM | POA: Diagnosis not present

## 2022-03-11 DIAGNOSIS — R600 Localized edema: Secondary | ICD-10-CM | POA: Diagnosis not present

## 2022-03-11 DIAGNOSIS — R262 Difficulty in walking, not elsewhere classified: Secondary | ICD-10-CM

## 2022-03-11 DIAGNOSIS — M25562 Pain in left knee: Secondary | ICD-10-CM | POA: Diagnosis not present

## 2022-03-11 NOTE — Therapy (Addendum)
OUTPATIENT PHYSICAL THERAPY LOWER EXTREMITY EVALUATION   Patient Name: Cassandra Galloway MRN: [id number] DOB:1990/06/04, 31 y.o., female Today's Date: 03/11/2022   PT End of Session - 03/11/22 0851     Visit Number 1    Number of Visits 24    Date for PT Re-Evaluation 06/06/22    Authorization Type [employer]    PT Start Time 0810    PT Stop Time 0845    PT Time Calculation (min) 35 min    Activity Tolerance Patient tolerated treatment well    Behavior During Therapy [facility] for tasks assessed/performed             Past Medical History:  Diagnosis Date   Anemia    Past Surgical History:  Procedure Laterality Date   ANTERIOR CRUCIATE LIGAMENT REPAIR Left 02/11/2022   Procedure: LEFT KNEE ACL RECONSTRUCTION WITH HAMSTRING AUTOGRAFT, MEDIAL MENISCAL REPAIR;  Surgeon: Meredith Pel, MD;  Location: [place];  Service: Orthopedics;  Laterality: Left;   BONE MARROW BIOPSY  02/11/2022   Procedure: BONE MARROW ASPIRATE;  Surgeon: Meredith Pel, MD;  Location: [facility] OR;  Service: Orthopedics;;   Patient Active Problem List   Diagnosis Date Noted   Left anterior cruciate ligament tear    Acute lateral meniscus tear of left knee    Acute medial meniscus tear of left knee     PCP:  Ladell Pier, MD  REFERRING PROVIDER: Meredith Pel MD  REFERRING DIAG: [phone] (ICD-10-CM) - S/P ACL reconstruction THERAPY DIAG:  Acute pain of left knee  Stiffness of left knee, not elsewhere classified  Difficulty in walking, not elsewhere classified  Localized edema  Rationale for Evaluation and Treatment: Rehabilitation  ONSET DATE: 02/11/22  SUBJECTIVE:   SUBJECTIVE:   SUBJECTIVE STATEMENT: Pt arriving to therapy s/p ACL reconstruction on 02/11/22 reporting left knee pain of 5/10. Pt stating her pain increases at night to 7-8/10. Pt having trouble sleeping due to pain. Pt stating she has been elevating but not icing as much.   PERTINENT HISTORY: Initial  injury occurred on 11/16/21 where she injured playing musical chairs.  ACL reconstruction on 02/11/22 PAIN:  NPRS scale: 5/10 Pain location: left knee Pain description: achy, throbbing Aggravating factors: sleeping, bending,  Relieving factors: elevation  PRECAUTIONS: Other: ACL precautions  WEIGHT BEARING RESTRICTIONS: No  FALLS:  Has patient fallen in last 6 months? Yes. Number of falls when injury occurred on 11/16/2021  LIVING ENVIRONMENT: Lives with: lives with their family and lives with their partner Lives in: House/apartment Stairs: Yes: External: 15-16 steps; can reach both Has following equipment at home: None  OCCUPATION: works at [employer]: Stop hurting, walk better, sleep without pain and waking up, get back to moving correctly. Get back to gym   OBJECTIVE:   DIAGNOSTIC FINDINGS:  Impression from 12/12/2021 prior to surgery, no post operative images available to view 1. Complete ACL tear. 2. Longitudinal tear of the posterior horn of the medial meniscus towards the meniscal capsular junction. 3. Irregularity versus small tear along the superior surface of the body of the lateral meniscus. 4. Osseous contusions of the posterolateral tibial plateau and posteromedial tibial plateau.   PATIENT SURVEYS:  03/11/22: FOTO intake:   47%  COGNITION: Overall cognitive status: WFL    SENSATION: WFL  EDEMA:  03/11/22:  Rt Knee circumferential: 43 centimeters Left Knee Circumferential: 46 centimeters    POSTURE: rounded shoulders and forward head  PALPATION: Tenderness noted  along incision sites   LOWER EXTREMITY ROM:  ROM A: active P: passive Right Eval 03/11/22 Left Eval 03/11/22  Hip flexion    Hip extension    Hip abduction    Hip adduction    Hip internal rotation    Hip external rotation    Knee flexion A: 120 P: 125 A: 85 P: 90  Knee extension A: -4 degrees of hyperextension A: 5 deg flexed P: 3  deg flexed   (Blank rows = not tested)  LOWER EXTREMITY MMT:  MMT Right eval Left eval  Hip flexion 5/5 4/5  Hip extension    Hip abduction    Hip adduction    Hip internal rotation    Hip external rotation    Knee flexion 5/5 3/5  Knee extension 5/5 3/5  Ankle dorsiflexion 5/5 5/5  Ankle plantarflexion 5/5 5/5  Ankle inversion    Ankle eversion     (Blank rows = not tested)   FUNCTIONAL TESTS:  5 time Sit to stand: 30 seconds c UE support  GAIT: Distance walked: 40 feet  Assistive device utilized: None Level of assistance: Complete Independence Comments: antalgic gait with decreased knee flexion on left LE and decreased heel strike with decreased wt bearing on the left   TODAY'S TREATMENT                                                                          DATE: 03/11/22:  Therex: HEP instruction/performance c cues for techniques, handout provided.  Trial set performed of each for comprehension and symptom assessment.  See below for exercise list  PATIENT EDUCATION:  Education details: HEP, POC, BFR importance of elevation and ice to control swelling Person educated: Patient Education method: Explanation, Demonstration, Verbal cues, and Handouts Education comprehension: verbalized understanding, returned demonstration, and verbal cues required  HOME EXERCISE PROGRAM: Access Code: [email or web address] URL: [email or web address] Date: 03/11/2022 Prepared by: Kearney Hard  Exercises - Supine Active Straight Leg Raise  - 4 x daily - 7 x weekly - 2-3 sets - 10 reps - Long Sitting Quad Set with Towel Roll Under Heel  - 4 x daily - 7 x weekly - 2-3 sets - 10 reps - 5 seconds hold - Seated Small Alternating Straight Leg Lifts with Heel Touch  - 4 x daily - 7 x weekly - 2-3 sets - 10 reps - Sit to Stand  - 4 x daily - 7 x weekly - 2-3 sets - 10 reps - Heel Raises with Counter Support  - 4 x daily - 7 x weekly - 2-3 sets - 10 reps - Seated Knee Flexion Extension  AROM   - 4 x daily - 7 x weekly - 2 sets - 10 reps - 5 seconds hold  ASSESSMENT:  CLINICAL IMPRESSION: Patient is a 31 y.o. who comes to clinic with complaints of left knee pain s/p with mobility, strength and movement coordination deficits that impair their ability to perform usual daily and recreational functional activities without increase difficulty/symptoms at this time.  Patient to benefit from skilled PT services to address impairments and limitations to improve to previous level of function without restriction secondary to condition.   OBJECTIVE IMPAIRMENTS: decreased activity tolerance, decreased  balance, decreased mobility, difficulty walking, decreased ROM, decreased strength, and pain.   ACTIVITY LIMITATIONS: bending, sitting, standing, squatting, sleeping, and dressing  PARTICIPATION LIMITATIONS: cleaning, laundry, driving, shopping, community activity, and occupation  PERSONAL FACTORS: no specific factors are also affecting patient's functional outcome.   REHAB POTENTIAL: Good  CLINICAL DECISION MAKING: Stable/uncomplicated  EVALUATION COMPLEXITY: Low   GOALS: Goals reviewed with patient? Yes  SHORT TERM GOALS: (target date for Short term goals are 4 weeks 04/10/22)   1.  Patient will demonstrate independent use of home exercise program to maintain progress from in clinic treatments.  Goal status: New  2. Pt will improve her 5 time sit to stand to </= 20 seconds c/s UE support  Goal Status: New  LONG TERM GOALS: (target dates for all long term goals are 12 weeks  06/06/22 )   1. Patient will demonstrate/report pain at worst less than or equal to 2/10 to facilitate minimal limitation in daily activity secondary to pain symptoms.  Goal status: New   2. Patient will demonstrate independent use of home exercise program to facilitate ability to maintain/progress functional gains from skilled physical therapy services.  Goal status: New   3. Patient will demonstrate  FOTO outcome > or = 72 % to indicate reduced disability due to condition.  Goal status: New   4.  Patient will demonstrate left  LE MMT 5/5 throughout to faciltiate usual transfers, stairs, squatting at [facility] for daily life.   Goal status: New   5.  Patient will demonstrate Left knee flexion to >/= 120 degrees for improvements in functional mobility.   Goal status: New   6.  Pt will be able to navigate 1 flight of stairs with no hand rail with step over step gait pattern.   Goal status: New  7. Pt will be able to pick up 15# object from floor to counter height using correct body mechanics while facilitating squat positioning for lifting.    Goal status: New        PLAN:  PT FREQUENCY: 1-2x/week  PT DURATION: 12 weeks  PLANNED INTERVENTIONS: Therapeutic exercises, Therapeutic activity, Neuro Muscular re-education, Balance training, Gait training, Patient/Family education, Joint mobilization, Stair training, DME instructions, Dry Needling, Electrical stimulation, Traction, Cryotherapy, vasopneumatic deviceMoist heat, Taping, Ultrasound, Ionotophoresis 86m/ml Dexamethasone, and Manual therapy.  All included unless contraindicated  PLAN FOR NEXT SESSION: Review HEP knowledge/results, Bike vs Nustep, Quad strengthening, BFR, vasopneumatic as needed       JOretha Caprice PT, MPT 03/11/2022, 11:52 AM

## 2022-03-12 ENCOUNTER — Encounter: Payer: Self-pay | Admitting: Orthopedic Surgery

## 2022-03-18 ENCOUNTER — Ambulatory Visit (INDEPENDENT_AMBULATORY_CARE_PROVIDER_SITE_OTHER): Payer: 59 | Admitting: Physical Therapy

## 2022-03-18 ENCOUNTER — Encounter: Payer: Self-pay | Admitting: Physical Therapy

## 2022-03-18 DIAGNOSIS — M25562 Pain in left knee: Secondary | ICD-10-CM | POA: Diagnosis not present

## 2022-03-18 DIAGNOSIS — R600 Localized edema: Secondary | ICD-10-CM

## 2022-03-18 DIAGNOSIS — M25662 Stiffness of left knee, not elsewhere classified: Secondary | ICD-10-CM | POA: Diagnosis not present

## 2022-03-18 DIAGNOSIS — R262 Difficulty in walking, not elsewhere classified: Secondary | ICD-10-CM

## 2022-03-18 NOTE — Therapy (Signed)
OUTPATIENT PHYSICAL THERAPY TREATMENT NOTE   Patient Name: Cassandra Galloway MRN: [id number] DOB:04-15-1991, 31 y.o., female Today's Date: 03/18/2022  PCP: Ladell Pier, MD  REFERRING PROVIDER: Meredith Pel MD   END OF SESSION:   PT End of Session - 03/18/22 0817     Visit Number 2    Number of Visits 24    Date for PT Re-Evaluation 06/06/22    PT Start Time 0815    PT Stop Time 0850    PT Time Calculation (min) 35 min    Activity Tolerance Patient tolerated treatment well    Behavior During Therapy [facility] for tasks assessed/performed             Past Medical History:  Diagnosis Date   Anemia    Past Surgical History:  Procedure Laterality Date   ANTERIOR CRUCIATE LIGAMENT REPAIR Left 02/11/2022   Procedure: LEFT KNEE ACL RECONSTRUCTION WITH HAMSTRING AUTOGRAFT, MEDIAL MENISCAL REPAIR;  Surgeon: Meredith Pel, MD;  Location: [place];  Service: Orthopedics;  Laterality: Left;   BONE MARROW BIOPSY  02/11/2022   Procedure: BONE MARROW ASPIRATE;  Surgeon: Meredith Pel, MD;  Location: [facility] OR;  Service: Orthopedics;;   Patient Active Problem List   Diagnosis Date Noted   Left anterior cruciate ligament tear    Acute lateral meniscus tear of left knee    Acute medial meniscus tear of left knee     REFERRING DIAG:  Z98.890 (ICD-10-CM) - S/P ACL reconstruction   THERAPY DIAG:  Acute pain of left knee  Stiffness of left knee, not elsewhere classified  Difficulty in walking, not elsewhere classified  Localized edema  Rationale for Evaluation and Treatment Rehabilitation  PERTINENT HISTORY: Initial injury occurred on 11/16/21 where she injured playing musical chairs.  ACL reconstruction on 02/11/22  PRECAUTIONS: ACL protocol  SUBJECTIVE:                                                                                                                                                                                      SUBJECTIVE  STATEMENT:  Pt stating she has been doing her HEP and feels like her pain is better. Pt stating her swelling is not as bad over the last week. Pt reporting compliance in her HEP.    PAIN:  Are you having pain? Yes: NPRS scale: 3/10 Pain location: anterior medial knee pain Pain description: achy Aggravating factors: bending, standing Relieving factors: ice, over the counter pain meds at night   OBJECTIVE: (objective measures completed at initial evaluation unless otherwise dated)  DIAGNOSTIC FINDINGS:  Impression from 12/12/2021 prior to surgery, no post operative images available to  view 1. Complete ACL tear. 2. Longitudinal tear of the posterior horn of the medial meniscus towards the meniscal capsular junction. 3. Irregularity versus small tear along the superior surface of the body of the lateral meniscus. 4. Osseous contusions of the posterolateral tibial plateau and posteromedial tibial plateau.     PATIENT SURVEYS:  03/11/22: FOTO intake:   47%   COGNITION: Overall cognitive status: WFL                SENSATION: WFL   EDEMA:  03/11/22:  Rt Knee circumferential: 43 centimeters Left Knee Circumferential: 46 centimeters       POSTURE: rounded shoulders and forward head   PALPATION: Tenderness noted along incision sites     LOWER EXTREMITY ROM:   ROM A: active P: passive Right Eval 03/11/22 Left Eval 03/11/22 Left 03/18/22  Hip flexion       Hip extension       Hip abduction       Hip adduction       Hip internal rotation       Hip external rotation       Knee flexion A: 120 P: 125 A: 85 P: 90 A: 115 P: 118  Knee extension A: -4 degrees of hyperextension A: 5 deg flexed P: 3 deg flexed A: 4 deg flexed   (Blank rows = not tested)   LOWER EXTREMITY MMT:   MMT Right 03/11/22 Left 03/11/22  Hip flexion 5/5 4/5  Hip extension      Hip abduction      Hip adduction      Hip internal rotation      Hip external rotation      Knee flexion 5/5 3/5   Knee extension 5/5 3/5  Ankle dorsiflexion 5/5 5/5  Ankle plantarflexion 5/5 5/5  Ankle inversion      Ankle eversion       (Blank rows = not tested)     FUNCTIONAL TESTS:  03/11/22: 5 time Sit to stand: 30 seconds c UE support   GAIT: Distance walked: 40 feet  Assistive device utilized: None Level of assistance: Complete Independence Comments: antalgic gait with decreased knee flexion on left LE and decreased heel strike with decreased wt bearing on the left     TODAY'S TREATMENT                                                                          DATE: 03/18/22:  TherEx: Recumbent bike: Level 3 x 8 minutes BFR: (cuff # 5, 125 mmHg occulusion pressure, 95 mmHg exercises pressure) SAQ: x 30:15:15:15    holding 5 sec c 60 sec rest break between sets  Modalities: Vasopneumatic:  34 deg, x 10 minutes, medium compression   03/11/22:  Therex: HEP instruction/performance c cues for techniques, handout provided.  Trial set performed of each for comprehension and symptom assessment.  See below for exercise list   PATIENT EDUCATION:  Education details: HEP, POC, BFR importance of elevation and ice to control swelling Person educated: Patient Education method: Explanation, Demonstration, Verbal cues, and Handouts Education comprehension: verbalized understanding, returned demonstration, and verbal cues required   HOME EXERCISE PROGRAM: Access Code: [email or web address] URL: [email or web address] Date: 03/11/2022 Prepared by:  Kearney Hard   Exercises - Supine Active Straight Leg Raise  - 4 x daily - 7 x weekly - 2-3 sets - 10 reps - Long Sitting Quad Set with Towel Roll Under Heel  - 4 x daily - 7 x weekly - 2-3 sets - 10 reps - 5 seconds hold - Seated Small Alternating Straight Leg Lifts with Heel Touch  - 4 x daily - 7 x weekly - 2-3 sets - 10 reps - Sit to Stand  - 4 x daily - 7 x weekly - 2-3 sets - 10 reps - Heel Raises with Counter Support  - 4 x daily - 7 x  weekly - 2-3 sets - 10 reps - Seated Knee Flexion Extension AROM   - 4 x daily - 7 x weekly - 2 sets - 10 reps - 5 seconds hold   ASSESSMENT:   CLINICAL IMPRESSION: Pt tolerating exercises well with pain increasing to 4/10 during session. BFR performed in limited time due to pt arriving 15 minutes late. Increased time for BFR set up this visit. Continue to progress with Left quad strengthening to maximize function.     OBJECTIVE IMPAIRMENTS: decreased activity tolerance, decreased balance, decreased mobility, difficulty walking, decreased ROM, decreased strength, and pain.    ACTIVITY LIMITATIONS: bending, sitting, standing, squatting, sleeping, and dressing   PARTICIPATION LIMITATIONS: cleaning, laundry, driving, shopping, community activity, and occupation   PERSONAL FACTORS: no specific factors are also affecting patient's functional outcome.    REHAB POTENTIAL: Good   CLINICAL DECISION MAKING: Stable/uncomplicated   EVALUATION COMPLEXITY: Low     GOALS: Goals reviewed with patient? Yes   SHORT TERM GOALS: (target date for Short term goals are 4 weeks 04/10/22)    1.  Patient will demonstrate independent use of home exercise program to maintain progress from in clinic treatments.   Goal status: On-going 03/18/22   2. Pt will improve her 5 time sit to stand to </= 20 seconds c/s UE support           Goal Status: New   LONG TERM GOALS: (target dates for all long term goals are 12 weeks  06/06/22 )   1. Patient will demonstrate/report pain at worst less than or equal to 2/10 to facilitate minimal limitation in daily activity secondary to pain symptoms.   Goal status: New   2. Patient will demonstrate independent use of home exercise program to facilitate ability to maintain/progress functional gains from skilled physical therapy services.   Goal status: New   3. Patient will demonstrate FOTO outcome > or = 72 % to indicate reduced disability due to condition.   Goal  status: New   4.  Patient will demonstrate left  LE MMT 5/5 throughout to faciltiate usual transfers, stairs, squatting at [facility] for daily life.    Goal status: New   5.  Patient will demonstrate Left knee flexion to >/= 120 degrees for improvements in functional mobility.    Goal status: New   6.  Pt will be able to navigate 1 flight of stairs with no hand rail with step over step gait pattern.    Goal status: New   7. Pt will be able to pick up 15# object from floor to counter height using correct body mechanics while facilitating squat positioning for lifting.              Goal status: New       PLAN:   PT FREQUENCY: 1-2x/week  PT DURATION: 12 weeks   PLANNED INTERVENTIONS: Therapeutic exercises, Therapeutic activity, Neuro Muscular re-education, Balance training, Gait training, Patient/Family education, Joint mobilization, Stair training, DME instructions, Dry Needling, Electrical stimulation, Traction, Cryotherapy, vasopneumatic deviceMoist heat, Taping, Ultrasound, Ionotophoresis 27m/ml Dexamethasone, and Manual therapy.  All included unless contraindicated   PLAN FOR NEXT SESSION: Review HEP knowledge/results, Bike vs Nustep, Quad strengthening, BFR, vasopneumatic as needed    JOretha Caprice PT, MPT 03/18/2022, 8:19 AM

## 2022-03-20 ENCOUNTER — Ambulatory Visit (INDEPENDENT_AMBULATORY_CARE_PROVIDER_SITE_OTHER): Payer: 59 | Admitting: Physical Therapy

## 2022-03-20 ENCOUNTER — Encounter: Payer: Self-pay | Admitting: Physical Therapy

## 2022-03-20 DIAGNOSIS — R262 Difficulty in walking, not elsewhere classified: Secondary | ICD-10-CM

## 2022-03-20 DIAGNOSIS — M25662 Stiffness of left knee, not elsewhere classified: Secondary | ICD-10-CM | POA: Diagnosis not present

## 2022-03-20 DIAGNOSIS — M25562 Pain in left knee: Secondary | ICD-10-CM

## 2022-03-20 DIAGNOSIS — R600 Localized edema: Secondary | ICD-10-CM | POA: Diagnosis not present

## 2022-03-20 NOTE — Therapy (Signed)
OUTPATIENT PHYSICAL THERAPY TREATMENT NOTE   Patient Name: Cassandra Galloway MRN: [id number] DOB:May 03, 1991, 31 y.o., female Today's Date: 03/20/2022  PCP: Ladell Pier, MD  REFERRING PROVIDER: Meredith Pel MD   END OF SESSION:   PT End of Session - 03/20/22 0940     Visit Number 3    Number of Visits 24    Date for PT Re-Evaluation 06/06/22    Authorization Type [employer]    PT Start Time 0937    PT Stop Time 1015    PT Time Calculation (min) 38 min    Activity Tolerance Patient tolerated treatment well    Behavior During Therapy [facility] for tasks assessed/performed             Past Medical History:  Diagnosis Date   Anemia    Past Surgical History:  Procedure Laterality Date   ANTERIOR CRUCIATE LIGAMENT REPAIR Left 02/11/2022   Procedure: LEFT KNEE ACL RECONSTRUCTION WITH HAMSTRING AUTOGRAFT, MEDIAL MENISCAL REPAIR;  Surgeon: Meredith Pel, MD;  Location: [place];  Service: Orthopedics;  Laterality: Left;   BONE MARROW BIOPSY  02/11/2022   Procedure: BONE MARROW ASPIRATE;  Surgeon: Meredith Pel, MD;  Location: [facility] OR;  Service: Orthopedics;;   Patient Active Problem List   Diagnosis Date Noted   Left anterior cruciate ligament tear    Acute lateral meniscus tear of left knee    Acute medial meniscus tear of left knee     REFERRING DIAG:  Z98.890 (ICD-10-CM) - S/P ACL reconstruction   THERAPY DIAG:  Acute pain of left knee  Stiffness of left knee, not elsewhere classified  Difficulty in walking, not elsewhere classified  Localized edema  Rationale for Evaluation and Treatment Rehabilitation  PERTINENT HISTORY: Initial injury occurred on 11/16/21 where she injured playing musical chairs.  ACL reconstruction on 02/11/22  PRECAUTIONS: ACL protocol  SUBJECTIVE:                                                                                                                                                                                       SUBJECTIVE STATEMENT:  Pt stating not too much pain overall today, did report some soreness after BFR training last time  PAIN:  Are you having pain? Yes: NPRS scale: 3/10 Pain location: anterior medial knee pain Pain description: achy Aggravating factors: bending, standing Relieving factors: ice, over the counter pain meds at night   OBJECTIVE: (objective measures completed at initial evaluation unless otherwise dated)  DIAGNOSTIC FINDINGS:  Impression from 12/12/2021 prior to surgery, no post operative images available to view 1. Complete ACL tear. 2. Longitudinal tear of the posterior horn  of the medial meniscus towards the meniscal capsular junction. 3. Irregularity versus small tear along the superior surface of the body of the lateral meniscus. 4. Osseous contusions of the posterolateral tibial plateau and posteromedial tibial plateau.     PATIENT SURVEYS:  03/11/22: FOTO intake:   47%   COGNITION: Overall cognitive status: WFL                SENSATION: WFL   EDEMA:  03/11/22:  Rt Knee circumferential: 43 centimeters Left Knee Circumferential: 46 centimeters       POSTURE: rounded shoulders and forward head   PALPATION: Tenderness noted along incision sites     LOWER EXTREMITY ROM:   ROM A: active P: passive Right Eval 03/11/22 Left Eval 03/11/22 Left 03/18/22  Hip flexion       Hip extension       Hip abduction       Hip adduction       Hip internal rotation       Hip external rotation       Knee flexion A: 120 P: 125 A: 85 P: 90 A: 115 P: 118  Knee extension A: -4 degrees of hyperextension A: 5 deg flexed P: 3 deg flexed A: 4 deg flexed   (Blank rows = not tested)   LOWER EXTREMITY MMT:   MMT Right 03/11/22 Left 03/11/22  Hip flexion 5/5 4/5  Hip extension      Hip abduction      Hip adduction      Hip internal rotation      Hip external rotation      Knee flexion 5/5 3/5  Knee extension 5/5 3/5  Ankle dorsiflexion 5/5 5/5   Ankle plantarflexion 5/5 5/5  Ankle inversion      Ankle eversion       (Blank rows = not tested)     FUNCTIONAL TESTS:  03/11/22: 5 time Sit to stand: 30 seconds c UE support   GAIT: Distance walked: 40 feet  Assistive device utilized: None Level of assistance: Complete Independence Comments: antalgic gait with decreased knee flexion on left LE and decreased heel strike with decreased wt bearing on the left     TODAY'S TREATMENT                                                                          DATE: 03/18/22:  TherEx: Recumbent bike: Level 3 x 5  minutes BFR: (cuff # 5, 125 mmHg occulusion pressure, 95 mmHg exercises pressure) Quad sets 30:15:15:15 with 30 sec rest in between SAQ: x 30:15:15:15  30 sec rest break between sets LAQ 4X15 with 30 sec rest  Modalities: Vasopneumatic:  34 deg, x 10 minutes, medium compression  03/18/22:  TherEx: Recumbent bike: Level 3 x 8 minutes BFR: (cuff # 5, 125 mmHg occulusion pressure, 95 mmHg exercises pressure) SAQ: x 30:15:15:15    holding 5 sec c 60 sec rest break between sets  Modalities: Vasopneumatic:  34 deg, x 10 minutes, medium compression    PATIENT EDUCATION:  Education details: HEP, POC, BFR importance of elevation and ice to control swelling Person educated: Patient Education method: Explanation, Demonstration, Verbal cues, and Handouts Education comprehension: verbalized understanding, returned  demonstration, and verbal cues required   HOME EXERCISE PROGRAM: Access Code: [email or web address] URL: [email or web address] Date: 03/11/2022 Prepared by: Kearney Hard   Exercises - Supine Active Straight Leg Raise  - 4 x daily - 7 x weekly - 2-3 sets - 10 reps - Long Sitting Quad Set with Towel Roll Under Heel  - 4 x daily - 7 x weekly - 2-3 sets - 10 reps - 5 seconds hold - Seated Small Alternating Straight Leg Lifts with Heel Touch  - 4 x daily - 7 x weekly - 2-3 sets - 10 reps - Sit to Stand  - 4 x daily  - 7 x weekly - 2-3 sets - 10 reps - Heel Raises with Counter Support  - 4 x daily - 7 x weekly - 2-3 sets - 10 reps - Seated Knee Flexion Extension AROM   - 4 x daily - 7 x weekly - 2 sets - 10 reps - 5 seconds hold   ASSESSMENT:   CLINICAL IMPRESSION: Her left knee ROM is doing well post op, quad contraction is also doing well. I did progress her BFR exercises today with good tolerance but we will monitior her soreness from this and adjust as needed. PT will continue to work to progress with Left quad strengthening to maximize function.     OBJECTIVE IMPAIRMENTS: decreased activity tolerance, decreased balance, decreased mobility, difficulty walking, decreased ROM, decreased strength, and pain.    ACTIVITY LIMITATIONS: bending, sitting, standing, squatting, sleeping, and dressing   PARTICIPATION LIMITATIONS: cleaning, laundry, driving, shopping, community activity, and occupation   PERSONAL FACTORS: no specific factors are also affecting patient's functional outcome.    REHAB POTENTIAL: Good   CLINICAL DECISION MAKING: Stable/uncomplicated   EVALUATION COMPLEXITY: Low     GOALS: Goals reviewed with patient? Yes   SHORT TERM GOALS: (target date for Short term goals are 4 weeks 04/10/22)    1.  Patient will demonstrate independent use of home exercise program to maintain progress from in clinic treatments.   Goal status: On-going 03/18/22   2. Pt will improve her 5 time sit to stand to </= 20 seconds c/s UE support           Goal Status: New   LONG TERM GOALS: (target dates for all long term goals are 12 weeks  06/06/22 )   1. Patient will demonstrate/report pain at worst less than or equal to 2/10 to facilitate minimal limitation in daily activity secondary to pain symptoms.   Goal status: New   2. Patient will demonstrate independent use of home exercise program to facilitate ability to maintain/progress functional gains from skilled physical therapy services.   Goal  status: New   3. Patient will demonstrate FOTO outcome > or = 72 % to indicate reduced disability due to condition.   Goal status: New   4.  Patient will demonstrate left  LE MMT 5/5 throughout to faciltiate usual transfers, stairs, squatting at [facility] for daily life.    Goal status: New   5.  Patient will demonstrate Left knee flexion to >/= 120 degrees for improvements in functional mobility.    Goal status: New   6.  Pt will be able to navigate 1 flight of stairs with no hand rail with step over step gait pattern.    Goal status: New   7. Pt will be able to pick up 15# object from floor to counter height using correct body mechanics while facilitating squat positioning for lifting.  Goal status: New       PLAN:   PT FREQUENCY: 1-2x/week   PT DURATION: 12 weeks   PLANNED INTERVENTIONS: Therapeutic exercises, Therapeutic activity, Neuro Muscular re-education, Balance training, Gait training, Patient/Family education, Joint mobilization, Stair training, DME instructions, Dry Needling, Electrical stimulation, Traction, Cryotherapy, vasopneumatic deviceMoist heat, Taping, Ultrasound, Ionotophoresis 66m/ml Dexamethasone, and Manual therapy.  All included unless contraindicated   PLAN FOR NEXT SESSION: ACL with H.S. graft protocol, Bike vs Nu step Quad strengthening, BFR as tolerated, vasopneumatic as needed    BDebbe Odea PT, DPT 03/20/2022, 9:41 AM

## 2022-03-21 ENCOUNTER — Other Ambulatory Visit: Payer: Self-pay | Admitting: Surgical

## 2022-03-21 MED ORDER — METHOCARBAMOL 500 MG PO TABS: 500.0000 mg | ORAL_TABLET | Freq: Three times a day (TID) | ORAL | 1 refills | Status: DC | PRN

## 2022-03-21 MED ORDER — OXYCODONE-ACETAMINOPHEN 5-325 MG PO TABS: 1.0000 | ORAL_TABLET | Freq: Two times a day (BID) | ORAL | 0 refills | Status: AC | PRN

## 2022-03-23 NOTE — Telephone Encounter (Signed)
This was sent in on 11/17 for the record

## 2022-03-24 ENCOUNTER — Encounter: Payer: Self-pay | Admitting: Physical Therapy

## 2022-03-24 ENCOUNTER — Ambulatory Visit (INDEPENDENT_AMBULATORY_CARE_PROVIDER_SITE_OTHER): Payer: 59 | Admitting: Physical Therapy

## 2022-03-24 DIAGNOSIS — R262 Difficulty in walking, not elsewhere classified: Secondary | ICD-10-CM | POA: Diagnosis not present

## 2022-03-24 DIAGNOSIS — M25662 Stiffness of left knee, not elsewhere classified: Secondary | ICD-10-CM | POA: Diagnosis not present

## 2022-03-24 DIAGNOSIS — M25562 Pain in left knee: Secondary | ICD-10-CM | POA: Diagnosis not present

## 2022-03-24 DIAGNOSIS — R600 Localized edema: Secondary | ICD-10-CM

## 2022-03-24 NOTE — Therapy (Signed)
OUTPATIENT PHYSICAL THERAPY TREATMENT NOTE   Patient Name: Cassandra Galloway MRN: [id number] DOB:1991/01/16, 31 y.o., female Today's Date: 03/24/2022  PCP: Ladell Pier, MD  REFERRING PROVIDER: Meredith Pel MD   END OF SESSION:   PT End of Session - 03/24/22 0910     Visit Number 4    Number of Visits 24    Date for PT Re-Evaluation 06/06/22    Authorization Type [employer]    PT Start Time [phone]    PT Stop Time 0930    PT Time Calculation (min) 43 min    Activity Tolerance Patient tolerated treatment well    Behavior During Therapy [facility] for tasks assessed/performed              Past Medical History:  Diagnosis Date   Anemia    Past Surgical History:  Procedure Laterality Date   ANTERIOR CRUCIATE LIGAMENT REPAIR Left 02/11/2022   Procedure: LEFT KNEE ACL RECONSTRUCTION WITH HAMSTRING AUTOGRAFT, MEDIAL MENISCAL REPAIR;  Surgeon: Meredith Pel, MD;  Location: [place];  Service: Orthopedics;  Laterality: Left;   BONE MARROW BIOPSY  02/11/2022   Procedure: BONE MARROW ASPIRATE;  Surgeon: Meredith Pel, MD;  Location: [facility] OR;  Service: Orthopedics;;   Patient Active Problem List   Diagnosis Date Noted   Left anterior cruciate ligament tear    Acute lateral meniscus tear of left knee    Acute medial meniscus tear of left knee     REFERRING DIAG:  Z98.890 (ICD-10-CM) - S/P ACL reconstruction   THERAPY DIAG:  Acute pain of left knee  Stiffness of left knee, not elsewhere classified  Difficulty in walking, not elsewhere classified  Localized edema  Rationale for Evaluation and Treatment Rehabilitation  PERTINENT HISTORY: Initial injury occurred on 11/16/21 where she injured playing musical chairs.  ACL reconstruction on 02/11/22  PRECAUTIONS: ACL protocol  SUBJECTIVE:                                                                                                                                                                                       SUBJECTIVE STATEMENT:  Pt reporting 2/10 pain in her left knee more with flexion.   PAIN:  Are you having pain? Yes: NPRS scale: 2/10 Pain location: anterior medial knee pain Pain description: achy Aggravating factors: bending, standing Relieving factors: ice, over the counter pain meds at night   OBJECTIVE: (objective measures completed at initial evaluation unless otherwise dated)  DIAGNOSTIC FINDINGS:  Impression from 12/12/2021 prior to surgery, no post operative images available to view 1. Complete ACL tear. 2. Longitudinal tear of the posterior horn of the medial meniscus  towards the meniscal capsular junction. 3. Irregularity versus small tear along the superior surface of the body of the lateral meniscus. 4. Osseous contusions of the posterolateral tibial plateau and posteromedial tibial plateau.     PATIENT SURVEYS:  03/11/22: FOTO intake:   47%   COGNITION: Overall cognitive status: WFL                SENSATION: WFL   EDEMA:  03/11/22:  Rt Knee circumferential: 43 centimeters Left Knee Circumferential: 46 centimeters       POSTURE: rounded shoulders and forward head   PALPATION: Tenderness noted along incision sites     LOWER EXTREMITY ROM:   ROM A: active P: passive Right Eval 03/11/22 Left Eval 03/11/22 Left 03/18/22 Left 03/24/22  Hip flexion        Hip extension        Hip abduction        Hip adduction        Hip internal rotation        Hip external rotation        Knee flexion A: 120 P: 125 A: 85 P: 90 A: 115 P: 118 A: 116   Knee extension A: -4 degrees of hyperextension A: 5 deg flexed P: 3 deg flexed A: 4 deg flexed A: 2 deg flexed   (Blank rows = not tested)   LOWER EXTREMITY MMT:   MMT Right 03/11/22 Left 03/11/22  Hip flexion 5/5 4/5  Hip extension      Hip abduction      Hip adduction      Hip internal rotation      Hip external rotation      Knee flexion 5/5 3/5  Knee extension 5/5 3/5  Ankle  dorsiflexion 5/5 5/5  Ankle plantarflexion 5/5 5/5  Ankle inversion      Ankle eversion       (Blank rows = not tested)     FUNCTIONAL TESTS:  03/11/22: 5 time Sit to stand: 30 seconds c UE support 03/24/22: 5 time sit to stand: 16 sec no UE support   GAIT: Distance walked: 40 feet  Assistive device utilized: None Level of assistance: Complete Independence Comments: antalgic gait with decreased knee flexion on left LE and decreased heel strike with decreased wt bearing on the left     TODAY'S TREATMENT                                                                          DATE: 03/24/22:  TherEx: Nustep: Level 5 x 8 minutes BFR: (cuff # 5, 125 mmHg occulusion pressure, 95 mmHg exercises pressure) Quad sets 30:15:15:15 with 30 sec rest in between sets SAQ: x 30:15:15:15  30 sec rest break between sets LAQ 30:15:15:15 with 30 sec rest between sets  Modalities: Vasopneumatic:  34 deg, x 10 minutes, medium compression   03/18/22:  TherEx: Recumbent bike: Level 3 x 5  minutes BFR: (cuff # 5, 125 mmHg occulusion pressure, 95 mmHg exercises pressure) Quad sets 30:15:15:15 with 30 sec rest in between SAQ: x 30:15:15:15  30 sec rest break between sets LAQ 4X15 with 30 sec rest  Modalities: Vasopneumatic:  34 deg, x 10 minutes, medium compression  03/18/22:  TherEx: Recumbent bike: Level 3 x 8 minutes BFR: (cuff # 5, 125 mmHg occulusion pressure, 95 mmHg exercises pressure) SAQ: x 30:15:15:15    holding 5 sec c 60 sec rest break between sets  Modalities: Vasopneumatic:  34 deg, x 10 minutes, medium compression    PATIENT EDUCATION:  Education details: HEP, POC, BFR importance of elevation and ice to control swelling Person educated: Patient Education method: Consulting civil engineer, Demonstration, Verbal cues, and Handouts Education comprehension: verbalized understanding, returned demonstration, and verbal cues required   HOME EXERCISE PROGRAM: Access Code: [email or web address] URL:  [email or web address] Date: 03/11/2022 Prepared by: Kearney Hard   Exercises - Supine Active Straight Leg Raise  - 4 x daily - 7 x weekly - 2-3 sets - 10 reps - Long Sitting Quad Set with Towel Roll Under Heel  - 4 x daily - 7 x weekly - 2-3 sets - 10 reps - 5 seconds hold - Seated Small Alternating Straight Leg Lifts with Heel Touch  - 4 x daily - 7 x weekly - 2-3 sets - 10 reps - Sit to Stand  - 4 x daily - 7 x weekly - 2-3 sets - 10 reps - Heel Raises with Counter Support  - 4 x daily - 7 x weekly - 2-3 sets - 10 reps - Seated Knee Flexion Extension AROM   - 4 x daily - 7 x weekly - 2 sets - 10 reps - 5 seconds hold   ASSESSMENT:   CLINICAL IMPRESSION: Pt arriving today with 2/10 pain with left knee flexion movements. Pt reporting good tolerance to BFR treatment following last visit and good tolerance today throughout session. Pt also reporting less popping sensation with flexion of left knee.  Continue to maximize pt's function.     OBJECTIVE IMPAIRMENTS: decreased activity tolerance, decreased balance, decreased mobility, difficulty walking, decreased ROM, decreased strength, and pain.    ACTIVITY LIMITATIONS: bending, sitting, standing, squatting, sleeping, and dressing   PARTICIPATION LIMITATIONS: cleaning, laundry, driving, shopping, community activity, and occupation   PERSONAL FACTORS: no specific factors are also affecting patient's functional outcome.    REHAB POTENTIAL: Good   CLINICAL DECISION MAKING: Stable/uncomplicated   EVALUATION COMPLEXITY: Low     GOALS: Goals reviewed with patient? Yes   SHORT TERM GOALS: (target date for Short term goals are 4 weeks 04/10/22)    1.  Patient will demonstrate independent use of home exercise program to maintain progress from in clinic treatments.   Goal status: MET 03/24/22   2. Pt will improve her 5 time sit to stand to </= 20 seconds c/s UE support           Goal Status: on-going: 03/24/22   LONG  TERM GOALS: (target dates for all long term goals are 12 weeks  06/06/22 )   1. Patient will demonstrate/report pain at worst less than or equal to 2/10 to facilitate minimal limitation in daily activity secondary to pain symptoms.   Goal status: New   2. Patient will demonstrate independent use of home exercise program to facilitate ability to maintain/progress functional gains from skilled physical therapy services.   Goal status: New   3. Patient will demonstrate FOTO outcome > or = 72 % to indicate reduced disability due to condition.   Goal status: New   4.  Patient will demonstrate left  LE MMT 5/5 throughout to faciltiate usual transfers, stairs, squatting at [facility] for daily life.    Goal status: New   5.  Patient  will demonstrate Left knee flexion to >/= 120 degrees for improvements in functional mobility.    Goal status: New   6.  Pt will be able to navigate 1 flight of stairs with no hand rail with step over step gait pattern.    Goal status: New   7. Pt will be able to pick up 15# object from floor to counter height using correct body mechanics while facilitating squat positioning for lifting.              Goal status: New       PLAN:   PT FREQUENCY: 1-2x/week   PT DURATION: 12 weeks   PLANNED INTERVENTIONS: Therapeutic exercises, Therapeutic activity, Neuro Muscular re-education, Balance training, Gait training, Patient/Family education, Joint mobilization, Stair training, DME instructions, Dry Needling, Electrical stimulation, Traction, Cryotherapy, vasopneumatic deviceMoist heat, Taping, Ultrasound, Ionotophoresis 29m/ml Dexamethasone, and Manual therapy.  All included unless contraindicated   PLAN FOR NEXT SESSION: ACL with H.S. graft protocol, Bike vs Nu-step Quad strengthening, BFR as tolerated, vasopneumatic as needed    JOretha Caprice PT, MPT 03/24/2022, 9:22 AM

## 2022-03-26 ENCOUNTER — Encounter: Payer: Self-pay | Admitting: Physical Therapy

## 2022-03-26 ENCOUNTER — Ambulatory Visit (INDEPENDENT_AMBULATORY_CARE_PROVIDER_SITE_OTHER): Payer: 59 | Admitting: Physical Therapy

## 2022-03-26 DIAGNOSIS — R262 Difficulty in walking, not elsewhere classified: Secondary | ICD-10-CM | POA: Diagnosis not present

## 2022-03-26 DIAGNOSIS — M25562 Pain in left knee: Secondary | ICD-10-CM | POA: Diagnosis not present

## 2022-03-26 DIAGNOSIS — M25662 Stiffness of left knee, not elsewhere classified: Secondary | ICD-10-CM | POA: Diagnosis not present

## 2022-03-26 DIAGNOSIS — R600 Localized edema: Secondary | ICD-10-CM

## 2022-03-26 NOTE — Therapy (Signed)
OUTPATIENT PHYSICAL THERAPY TREATMENT NOTE   Patient Name: Cassandra Galloway MRN: [id number] DOB:02-11-1991, 31 y.o., female Today's Date: 03/26/2022  PCP: Ladell Pier, MD  REFERRING PROVIDER: Meredith Pel MD   END OF SESSION:   PT End of Session - 03/26/22 1132     Visit Number 5    Number of Visits 24    Date for PT Re-Evaluation 06/06/22    Authorization Type [employer]    PT Start Time 1100    PT Stop Time 1145    PT Time Calculation (min) 45 min    Activity Tolerance Patient tolerated treatment well    Behavior During Therapy WFL for tasks assessed/performed               Past Medical History:  Diagnosis Date   Anemia    Past Surgical History:  Procedure Laterality Date   ANTERIOR CRUCIATE LIGAMENT REPAIR Left 02/11/2022   Procedure: LEFT KNEE ACL RECONSTRUCTION WITH HAMSTRING AUTOGRAFT, MEDIAL MENISCAL REPAIR;  Surgeon: Meredith Pel, MD;  Location: [place];  Service: Orthopedics;  Laterality: Left;   BONE MARROW BIOPSY  02/11/2022   Procedure: BONE MARROW ASPIRATE;  Surgeon: Meredith Pel, MD;  Location: [facility] OR;  Service: Orthopedics;;   Patient Active Problem List   Diagnosis Date Noted   Left anterior cruciate ligament tear    Acute lateral meniscus tear of left knee    Acute medial meniscus tear of left knee     REFERRING DIAG:  Z98.890 (ICD-10-CM) - S/P ACL reconstruction   THERAPY DIAG:  Acute pain of left knee  Stiffness of left knee, not elsewhere classified  Difficulty in walking, not elsewhere classified  Localized edema  Rationale for Evaluation and Treatment Rehabilitation  PERTINENT HISTORY: Initial injury occurred on 11/16/21 where she injured playing musical chairs.  ACL reconstruction on 02/11/22  PRECAUTIONS: ACL protocol  SUBJECTIVE:                                                                                                                                                                                       SUBJECTIVE STATEMENT:   Pt reporting 6/10 pain in left knee first thing in the morning and 3/10 pain upon arrival after moving around a few hours this morning.   PAIN:  Are you having pain? Yes: NPRS scale: 2/10 Pain location: anterior medial knee pain Pain description: achy Aggravating factors: bending, standing Relieving factors: ice, over the counter pain meds at night   OBJECTIVE: (objective measures completed at initial evaluation unless otherwise dated)  DIAGNOSTIC FINDINGS:  Impression from 12/12/2021 prior to surgery, no post operative images available to  view 1. Complete ACL tear. 2. Longitudinal tear of the posterior horn of the medial meniscus towards the meniscal capsular junction. 3. Irregularity versus small tear along the superior surface of the body of the lateral meniscus. 4. Osseous contusions of the posterolateral tibial plateau and posteromedial tibial plateau.     PATIENT SURVEYS:  03/11/22: FOTO intake:   47%   COGNITION: Overall cognitive status: WFL                SENSATION: WFL   EDEMA:  03/11/22:  Rt Knee circumferential: 43 centimeters Left Knee Circumferential: 46 centimeters       POSTURE: rounded shoulders and forward head   PALPATION: Tenderness noted along incision sites     LOWER EXTREMITY ROM:   ROM A: active P: passive Right Eval 03/11/22 Left Eval 03/11/22 Left 03/18/22 Left 03/24/22 Left 03/26/22  Hip flexion         Hip extension         Hip abduction         Hip adduction         Hip internal rotation         Hip external rotation         Knee flexion A: 120 P: 125 A: 85 P: 90 A: 115 P: 118 A: 116  A:116  Knee extension A: -4 degrees of hyperextension A: 5 deg flexed P: 3 deg flexed A: 4 deg flexed A: 2 deg flexed A: 0 deg   (Blank rows = not tested)   LOWER EXTREMITY MMT:   MMT Right 03/11/22 Left 03/11/22  Hip flexion 5/5 4/5  Hip extension      Hip abduction      Hip adduction      Hip  internal rotation      Hip external rotation      Knee flexion 5/5 3/5  Knee extension 5/5 3/5  Ankle dorsiflexion 5/5 5/5  Ankle plantarflexion 5/5 5/5  Ankle inversion      Ankle eversion       (Blank rows = not tested)     FUNCTIONAL TESTS:  03/11/22: 5 time Sit to stand: 30 seconds c UE support 03/24/22: 5 time sit to stand: 16 sec no UE support 11/ 22/23: 5 time sit to stand: 9 seconds no UE support   GAIT: Distance walked: 40 feet  Assistive device utilized: None Level of assistance: Complete Independence Comments: antalgic gait with decreased knee flexion on left LE and decreased heel strike with decreased wt bearing on the left     TODAY'S TREATMENT                                                                          DATE: 03/26/22:  TherEx: Scifit bike Level 3 x 10 minutes (seat 4) Leg Press: Left LE only 37#  3 x 10 BFR: (cuff # 5, 125 mmHg occulusion pressure, 95 mmHg exercises pressure) LAQ 30:15:15:15 c 30 sec rest between sets SLR: x 30:15:15:15:15 c 30 sec rest break between sets Mini squats: x 15:15:15:15    Modalities: Vasopneumatic:  34 deg, x 10 minutes, medium compression   03/24/22:  TherEx: Nustep: Level 5 x 8 minutes BFR: (cuff #  5, 125 mmHg occulusion pressure, 95 mmHg exercises pressure) Quad sets 30:15:15:15 with 30 sec rest in between sets SAQ: x 30:15:15:15  30 sec rest break between sets LAQ 30:15:15:15 with 30 sec rest between sets  Modalities: Vasopneumatic:  34 deg, x 10 minutes, medium compression   03/18/22:  TherEx: Recumbent bike: Level 3 x 5  minutes BFR: (cuff # 5, 125 mmHg occulusion pressure, 95 mmHg exercises pressure) Quad sets 30:15:15:15 with 30 sec rest in between SAQ: x 30:15:15:15  30 sec rest break between sets LAQ 4X15 with 30 sec rest  Modalities: Vasopneumatic:  34 deg, x 10 minutes, medium compression      PATIENT EDUCATION:  Education details: HEP, POC, BFR importance of elevation and ice to control  swelling Person educated: Patient Education method: Consulting civil engineer, Demonstration, Verbal cues, and Handouts Education comprehension: verbalized understanding, returned demonstration, and verbal cues required   HOME EXERCISE PROGRAM: Access Code: [email or web address] URL: [email or web address] Date: 03/11/2022 Prepared by: Kearney Hard   Exercises - Supine Active Straight Leg Raise  - 4 x daily - 7 x weekly - 2-3 sets - 10 reps - Long Sitting Quad Set with Towel Roll Under Heel  - 4 x daily - 7 x weekly - 2-3 sets - 10 reps - 5 seconds hold - Seated Small Alternating Straight Leg Lifts with Heel Touch  - 4 x daily - 7 x weekly - 2-3 sets - 10 reps - Sit to Stand  - 4 x daily - 7 x weekly - 2-3 sets - 10 reps - Heel Raises with Counter Support  - 4 x daily - 7 x weekly - 2-3 sets - 10 reps - Seated Knee Flexion Extension AROM   - 4 x daily - 7 x weekly - 2 sets - 10 reps - 5 seconds hold   ASSESSMENT:   CLINICAL IMPRESSION: Pt arriving to therapy reporting 3/10 pain in her left knee. Pt reporting more pain when first waking up in the morning. Pt tolerating more standing exercises with BFR today. Pt has met all her STG's progressing toward LTG's set.     OBJECTIVE IMPAIRMENTS: decreased activity tolerance, decreased balance, decreased mobility, difficulty walking, decreased ROM, decreased strength, and pain.    ACTIVITY LIMITATIONS: bending, sitting, standing, squatting, sleeping, and dressing   PARTICIPATION LIMITATIONS: cleaning, laundry, driving, shopping, community activity, and occupation   PERSONAL FACTORS: no specific factors are also affecting patient's functional outcome.    REHAB POTENTIAL: Good   CLINICAL DECISION MAKING: Stable/uncomplicated   EVALUATION COMPLEXITY: Low     GOALS: Goals reviewed with patient? Yes   SHORT TERM GOALS: (target date for Short term goals are 4 weeks 04/10/22)    1.  Patient will demonstrate independent use of home exercise program  to maintain progress from in clinic treatments.   Goal status: MET 03/24/22   2. Pt will improve her 5 time sit to stand to </= 20 seconds c/s UE support           Goal Status: MET 03/26/22   LONG TERM GOALS: (target dates for all long term goals are 12 weeks  06/06/22 )   1. Patient will demonstrate/report pain at worst less than or equal to 2/10 to facilitate minimal limitation in daily activity secondary to pain symptoms.   Goal status: New   2. Patient will demonstrate independent use of home exercise program to facilitate ability to maintain/progress functional gains from skilled physical therapy services.   Goal status: New  3. Patient will demonstrate FOTO outcome > or = 72 % to indicate reduced disability due to condition.   Goal status: New   4.  Patient will demonstrate left  LE MMT 5/5 throughout to faciltiate usual transfers, stairs, squatting at [facility] for daily life.    Goal status: New   5.  Patient will demonstrate Left knee flexion to >/= 120 degrees for improvements in functional mobility.    Goal status: New   6.  Pt will be able to navigate 1 flight of stairs with no hand rail with step over step gait pattern.    Goal status: New   7. Pt will be able to pick up 15# object from floor to counter height using correct body mechanics while facilitating squat positioning for lifting.              Goal status: New       PLAN:   PT FREQUENCY: 1-2x/week   PT DURATION: 12 weeks   PLANNED INTERVENTIONS: Therapeutic exercises, Therapeutic activity, Neuro Muscular re-education, Balance training, Gait training, Patient/Family education, Joint mobilization, Stair training, DME instructions, Dry Needling, Electrical stimulation, Traction, Cryotherapy, vasopneumatic deviceMoist heat, Taping, Ultrasound, Ionotophoresis 55m/ml Dexamethasone, and Manual therapy.  All included unless contraindicated   PLAN FOR NEXT SESSION: ACL with H.S. graft protocol, Bike vs Nu-step Quad  strengthening, BFR as tolerated, vasopneumatic as needed    JOretha Caprice PT, MPT 03/26/2022, 11:33 AM

## 2022-03-31 ENCOUNTER — Encounter: Payer: Self-pay | Admitting: Physical Therapy

## 2022-03-31 ENCOUNTER — Ambulatory Visit (INDEPENDENT_AMBULATORY_CARE_PROVIDER_SITE_OTHER): Payer: 59 | Admitting: Physical Therapy

## 2022-03-31 DIAGNOSIS — M25562 Pain in left knee: Secondary | ICD-10-CM | POA: Diagnosis not present

## 2022-03-31 DIAGNOSIS — R262 Difficulty in walking, not elsewhere classified: Secondary | ICD-10-CM | POA: Diagnosis not present

## 2022-03-31 DIAGNOSIS — M25662 Stiffness of left knee, not elsewhere classified: Secondary | ICD-10-CM | POA: Diagnosis not present

## 2022-03-31 DIAGNOSIS — R600 Localized edema: Secondary | ICD-10-CM

## 2022-03-31 NOTE — Therapy (Signed)
OUTPATIENT PHYSICAL THERAPY TREATMENT NOTE   Patient Name: Cassandra Galloway MRN: [id number] DOB:1990/07/27, 31 y.o., female Today's Date: 03/31/2022  PCP: Ladell Pier, MD  REFERRING PROVIDER: Meredith Pel MD   END OF SESSION:   PT End of Session - 03/31/22 0906     Visit Number 6    Number of Visits 24    Date for PT Re-Evaluation 06/06/22    Authorization Type [employer]    PT Start Time 0843    PT Stop Time 0928    PT Time Calculation (min) 45 min    Activity Tolerance Patient tolerated treatment well    Behavior During Therapy [facility] for tasks assessed/performed                Past Medical History:  Diagnosis Date   Anemia    Past Surgical History:  Procedure Laterality Date   ANTERIOR CRUCIATE LIGAMENT REPAIR Left 02/11/2022   Procedure: LEFT KNEE ACL RECONSTRUCTION WITH HAMSTRING AUTOGRAFT, MEDIAL MENISCAL REPAIR;  Surgeon: Meredith Pel, MD;  Location: [place];  Service: Orthopedics;  Laterality: Left;   BONE MARROW BIOPSY  02/11/2022   Procedure: BONE MARROW ASPIRATE;  Surgeon: Meredith Pel, MD;  Location: [place] [facility] OR;  Service: Orthopedics;;   Patient Active Problem List   Diagnosis Date Noted   Left anterior cruciate ligament tear    Acute lateral meniscus tear of left knee    Acute medial meniscus tear of left knee     REFERRING DIAG:  Z98.890 (ICD-10-CM) - S/P ACL reconstruction   THERAPY DIAG:  Acute pain of left knee  Stiffness of left knee, not elsewhere classified  Difficulty in walking, not elsewhere classified  Localized edema  Rationale for Evaluation and Treatment Rehabilitation  PERTINENT HISTORY: Initial injury occurred on 11/16/21 where she injured playing musical chairs.  ACL reconstruction on 02/11/22  PRECAUTIONS: ACL protocol  SUBJECTIVE:                                                                                                                                                                                       SUBJECTIVE STATEMENT:   Pt reporting 1/10 pain upon arrival and almost no pain this morning when waking up. Pt however reported 5-6/10 pain in her left knee yesterday.   PAIN:  Are you having pain? Yes: NPRS scale: 1/10 Pain location: anterior medial knee pain Pain description: achy Aggravating factors: bending, standing Relieving factors: ice, over the counter pain meds at night   OBJECTIVE: (objective measures completed at initial evaluation unless otherwise dated)  DIAGNOSTIC FINDINGS:  Impression from 12/12/2021 prior to surgery, no post operative images available  to view 1. Complete ACL tear. 2. Longitudinal tear of the posterior horn of the medial meniscus towards the meniscal capsular junction. 3. Irregularity versus small tear along the superior surface of the body of the lateral meniscus. 4. Osseous contusions of the posterolateral tibial plateau and posteromedial tibial plateau.     PATIENT SURVEYS:  03/11/22: FOTO intake:   47%   COGNITION: Overall cognitive status: WFL                SENSATION: WFL   EDEMA:  03/11/22:  Rt Knee circumferential: 43 centimeters Left Knee Circumferential: 46 centimeters   03/31/22:  Left Knee Circumferential: 44 centimeters     POSTURE: rounded shoulders and forward head   PALPATION: Tenderness noted along incision sites     LOWER EXTREMITY ROM:   ROM A: active P: passive Right Eval 03/11/22 Left Eval 03/11/22 Left 03/18/22 Left 03/24/22 Left 03/26/22 Left 03/31/22  Hip flexion          Hip extension          Hip abduction          Hip adduction          Knee flexion A: 120 P: 125 A: 85 P: 90 A: 115 P: 118 A: 116  A:116 A: 118  Knee extension A: -4 degrees of hyperextension A: 5 deg flexed P: 3 deg flexed A: 4 deg flexed A: 2 deg flexed A: 0 deg A: 0   (Blank rows = not tested)   LOWER EXTREMITY MMT:   MMT Right 03/11/22 Left 03/11/22  Hip flexion 5/5 4/5  Hip extension      Hip  abduction      Hip adduction      Hip internal rotation      Hip external rotation      Knee flexion 5/5 3/5  Knee extension 5/5 3/5  Ankle dorsiflexion 5/5 5/5  Ankle plantarflexion 5/5 5/5  Ankle inversion      Ankle eversion       (Blank rows = not tested)     FUNCTIONAL TESTS:  03/11/22: 5 time Sit to stand: 30 seconds c UE support 03/24/22: 5 time sit to stand: 16 sec no UE support 03/26/22: 5 time sit to stand: 9 seconds no UE support   GAIT: Distance walked: 40 feet  Assistive device utilized: None Level of assistance: Complete Independence Comments: antalgic gait with decreased knee flexion on left LE and decreased heel strike with decreased wt bearing on the left     TODAY'S TREATMENT                                                                          DATE: 03/2722:  TherEx: Recumbent bike Level 3 x 8 minutes Calf stretch x 2 holding 30 sec on slant board Leg Press: Left LE only 43#  3 x 10 BFR: (cuff # 5, 125 mmHg occulusion pressure, 95 mmHg exercises pressure) Seated SLR: x 30:15:15:15: c 30 sec rest break between sets Mini squats: x 15:15:15:15 Step ups on 6 inch step: x 30:15:15:15    Modalities: Vasopneumatic:  34 deg, x 10 minutes, medium compression    03/26/22:  TherEx: Scifit bike Level  3 x 10 minutes (seat 4) Leg Press: Left LE only 37#  3 x 10 BFR: (cuff # 5, 125 mmHg occulusion pressure, 95 mmHg exercises pressure) LAQ 30:15:15:15 c 30 sec rest between sets SLR: x 30:15:15:15:15 c 30 sec rest break between sets Mini squats: x 15:15:15:15    Modalities: Vasopneumatic:  34 deg, x 10 minutes, medium compression   03/24/22:  TherEx: Nustep: Level 5 x 8 minutes BFR: (cuff # 5, 125 mmHg occulusion pressure, 95 mmHg exercises pressure) Quad sets 30:15:15:15 with 30 sec rest in between sets SAQ: x 30:15:15:15  30 sec rest break between sets LAQ 30:15:15:15 with 30 sec rest between sets  Modalities: Vasopneumatic:  34 deg, x 10 minutes,  medium compression         PATIENT EDUCATION:  Education details: HEP, POC, BFR importance of elevation and ice to control swelling Person educated: Patient Education method: Consulting civil engineer, Demonstration, Verbal cues, and Handouts Education comprehension: verbalized understanding, returned demonstration, and verbal cues required   HOME EXERCISE PROGRAM: Access Code: [email or web address] URL: [email or web address] Date: 03/11/2022 Prepared by: Kearney Hard   Exercises - Supine Active Straight Leg Raise  - 4 x daily - 7 x weekly - 2-3 sets - 10 reps - Long Sitting Quad Set with Towel Roll Under Heel  - 4 x daily - 7 x weekly - 2-3 sets - 10 reps - 5 seconds hold - Seated Small Alternating Straight Leg Lifts with Heel Touch  - 4 x daily - 7 x weekly - 2-3 sets - 10 reps - Sit to Stand  - 4 x daily - 7 x weekly - 2-3 sets - 10 reps - Heel Raises with Counter Support  - 4 x daily - 7 x weekly - 2-3 sets - 10 reps - Seated Knee Flexion Extension AROM   - 4 x daily - 7 x weekly - 2 sets - 10 reps - 5 seconds hold   ASSESSMENT:   CLINICAL IMPRESSION: Pt arriving today reporting 1/10 pain in left knee. Pt stating when she woke up this morning her pain was almost unnoticeable. Pt tolerating BFR and strengthening exercises well today. Active left knee ROM is 0-118 degrees. Continue skilled PT to maximize pt's function.     OBJECTIVE IMPAIRMENTS: decreased activity tolerance, decreased balance, decreased mobility, difficulty walking, decreased ROM, decreased strength, and pain.    ACTIVITY LIMITATIONS: bending, sitting, standing, squatting, sleeping, and dressing   PARTICIPATION LIMITATIONS: cleaning, laundry, driving, shopping, community activity, and occupation   PERSONAL FACTORS: no specific factors are also affecting patient's functional outcome.    REHAB POTENTIAL: Good   CLINICAL DECISION MAKING: Stable/uncomplicated   EVALUATION COMPLEXITY: Low     GOALS: Goals  reviewed with patient? Yes   SHORT TERM GOALS: (target date for Short term goals are 4 weeks 04/10/22)    1.  Patient will demonstrate independent use of home exercise program to maintain progress from in clinic treatments.   Goal status: MET 03/24/22   2. Pt will improve her 5 time sit to stand to </= 20 seconds c/s UE support           Goal Status: MET 03/26/22   LONG TERM GOALS: (target dates for all long term goals are 12 weeks  06/06/22 )   1. Patient will demonstrate/report pain at worst less than or equal to 2/10 to facilitate minimal limitation in daily activity secondary to pain symptoms.   Goal status: New   2. Patient will demonstrate independent use  of home exercise program to facilitate ability to maintain/progress functional gains from skilled physical therapy services.   Goal status: New   3. Patient will demonstrate FOTO outcome > or = 72 % to indicate reduced disability due to condition.   Goal status: New   4.  Patient will demonstrate left  LE MMT 5/5 throughout to faciltiate usual transfers, stairs, squatting at [facility] for daily life.    Goal status: New   5.  Patient will demonstrate Left knee flexion to >/= 120 degrees for improvements in functional mobility.    Goal status: New   6.  Pt will be able to navigate 1 flight of stairs with no hand rail with step over step gait pattern.    Goal status: New   7. Pt will be able to pick up 15# object from floor to counter height using correct body mechanics while facilitating squat positioning for lifting.              Goal status: New       PLAN:   PT FREQUENCY: 1-2x/week   PT DURATION: 12 weeks   PLANNED INTERVENTIONS: Therapeutic exercises, Therapeutic activity, Neuro Muscular re-education, Balance training, Gait training, Patient/Family education, Joint mobilization, Stair training, DME instructions, Dry Needling, Electrical stimulation, Traction, Cryotherapy, vasopneumatic deviceMoist heat, Taping,  Ultrasound, Ionotophoresis 47m/ml Dexamethasone, and Manual therapy.  All included unless contraindicated   PLAN FOR NEXT SESSION: ACL with H.S. graft protocol, Bike  Quad strengthening, BFR as tolerated, vasopneumatic as needed    JOretha Caprice PT, MPT 03/31/2022, 9:09 AM

## 2022-04-02 ENCOUNTER — Encounter: Payer: Self-pay | Admitting: Physical Therapy

## 2022-04-02 ENCOUNTER — Ambulatory Visit (INDEPENDENT_AMBULATORY_CARE_PROVIDER_SITE_OTHER): Payer: 59 | Admitting: Physical Therapy

## 2022-04-02 DIAGNOSIS — M25662 Stiffness of left knee, not elsewhere classified: Secondary | ICD-10-CM

## 2022-04-02 DIAGNOSIS — R600 Localized edema: Secondary | ICD-10-CM | POA: Diagnosis not present

## 2022-04-02 DIAGNOSIS — M25562 Pain in left knee: Secondary | ICD-10-CM | POA: Diagnosis not present

## 2022-04-02 DIAGNOSIS — R262 Difficulty in walking, not elsewhere classified: Secondary | ICD-10-CM | POA: Diagnosis not present

## 2022-04-02 NOTE — Therapy (Signed)
OUTPATIENT PHYSICAL THERAPY TREATMENT NOTE   Patient Name: Cassandra Galloway MRN: [id number] DOB:10-04-1990, 31 y.o., female Today's Date: 04/02/2022  PCP: Ladell Pier, MD  REFERRING PROVIDER: Meredith Pel MD   END OF SESSION:   PT End of Session - 04/02/22 0855     Visit Number 7    Number of Visits 24    Date for PT Re-Evaluation 06/06/22    Authorization Type [employer]    PT Start Time 0852    PT Stop Time 0935    PT Time Calculation (min) 43 min    Activity Tolerance Patient tolerated treatment well    Behavior During Therapy [facility] for tasks assessed/performed                Past Medical History:  Diagnosis Date   Anemia    Past Surgical History:  Procedure Laterality Date   ANTERIOR CRUCIATE LIGAMENT REPAIR Left 02/11/2022   Procedure: LEFT KNEE ACL RECONSTRUCTION WITH HAMSTRING AUTOGRAFT, MEDIAL MENISCAL REPAIR;  Surgeon: Meredith Pel, MD;  Location: [place];  Service: Orthopedics;  Laterality: Left;   BONE MARROW BIOPSY  02/11/2022   Procedure: BONE MARROW ASPIRATE;  Surgeon: Meredith Pel, MD;  Location: [facility] OR;  Service: Orthopedics;;   Patient Active Problem List   Diagnosis Date Noted   Left anterior cruciate ligament tear    Acute lateral meniscus tear of left knee    Acute medial meniscus tear of left knee     REFERRING DIAG:  Z98.890 (ICD-10-CM) - S/P ACL reconstruction   THERAPY DIAG:  Acute pain of left knee  Stiffness of left knee, not elsewhere classified  Difficulty in walking, not elsewhere classified  Localized edema  Rationale for Evaluation and Treatment Rehabilitation  PERTINENT HISTORY: Initial injury occurred on 11/16/21 where she injured playing musical chairs.  ACL reconstruction on 02/11/22  PRECAUTIONS: ACL protocol  SUBJECTIVE:                                                                                                                                                                                       SUBJECTIVE STATEMENT:   Pt reporting 1/10 pain upon arrival and almost no pain this morning when waking up. Pt however reported 5-6/10 pain in her left knee yesterday.   PAIN:  Are you having pain? Yes: NPRS scale: 1/10 Pain location: anterior medial knee pain Pain description: achy Aggravating factors: bending, standing Relieving factors: ice, over the counter pain meds at night   OBJECTIVE: (objective measures completed at initial evaluation unless otherwise dated)  DIAGNOSTIC FINDINGS:  Impression from 12/12/2021 prior to surgery, no post operative images available  to view 1. Complete ACL tear. 2. Longitudinal tear of the posterior horn of the medial meniscus towards the meniscal capsular junction. 3. Irregularity versus small tear along the superior surface of the body of the lateral meniscus. 4. Osseous contusions of the posterolateral tibial plateau and posteromedial tibial plateau.     PATIENT SURVEYS:  03/11/22: FOTO intake:   47%   COGNITION: Overall cognitive status: WFL                SENSATION: WFL   EDEMA:  03/11/22:  Rt Knee circumferential: 43 centimeters Left Knee Circumferential: 46 centimeters   03/31/22:  Left Knee Circumferential: 44 centimeters     POSTURE: rounded shoulders and forward head   PALPATION: Tenderness noted along incision sites     LOWER EXTREMITY ROM:   ROM A: active P: passive Right Eval 03/11/22 Left Eval 03/11/22 Left 03/18/22 Left 03/24/22 Left 03/26/22 Left 03/31/22  Hip flexion          Hip extension          Hip abduction          Hip adduction          Knee flexion A: 120 P: 125 A: 85 P: 90 A: 115 P: 118 A: 116  A:116 A: 118  Knee extension A: -4 degrees of hyperextension A: 5 deg flexed P: 3 deg flexed A: 4 deg flexed A: 2 deg flexed A: 0 deg A: 0   (Blank rows = not tested)   LOWER EXTREMITY MMT:   MMT Right 03/11/22 Left 03/11/22  Hip flexion 5/5 4/5  Hip extension      Hip  abduction      Hip adduction      Hip internal rotation      Hip external rotation      Knee flexion 5/5 3/5  Knee extension 5/5 3/5  Ankle dorsiflexion 5/5 5/5  Ankle plantarflexion 5/5 5/5  Ankle inversion      Ankle eversion       (Blank rows = not tested)     FUNCTIONAL TESTS:  03/11/22: 5 time Sit to stand: 30 seconds c UE support 03/24/22: 5 time sit to stand: 16 sec no UE support 03/26/22: 5 time sit to stand: 9 seconds no UE support   GAIT: Distance walked: 40 feet  Assistive device utilized: None Level of assistance: Complete Independence Comments: antalgic gait with decreased knee flexion on left LE and decreased heel strike with decreased wt bearing on the left     TODAY'S TREATMENT                                                                          DATE: 03/2922:  TherEx: Recumbent bike Level 3 x 5 minutes Calf stretch x 2 holding 30 sec on slant board Leg Press: Left LE only 50#  3 x 10 BFR: (cuff # 5, 125 mmHg occulusion pressure, 95 mmHg exercises pressure) Standing hamstring curls 1# 30,15,15,15 Mini squats: x 15:15:15:15 Step ups on 6 inch step: x 30:15:15:15 Modalities: Vasopneumatic:  34 deg, x 10 minutes, medium compression  03/31/22:  TherEx: Recumbent bike Level 3 x 8 minutes Calf stretch x 2 holding 30 sec  on slant board Leg Press: Left LE only 43#  3 x 10 BFR: (cuff # 5, 125 mmHg occulusion pressure, 95 mmHg exercises pressure) Seated SLR: x 30:15:15:15: c 30 sec rest break between sets Mini squats: x 15:15:15:15 Step ups on 6 inch step: x 30:15:15:15    Modalities: Vasopneumatic:  34 deg, x 10 minutes, medium compression    03/26/22:  TherEx: Scifit bike Level 3 x 10 minutes (seat 4) Leg Press: Left LE only 37#  3 x 10 BFR: (cuff # 5, 125 mmHg occulusion pressure, 95 mmHg exercises pressure) LAQ 30:15:15:15 c 30 sec rest between sets SLR: x 30:15:15:15:15 c 30 sec rest break between sets Mini squats: x 15:15:15:15     Modalities: Vasopneumatic:  34 deg, x 10 minutes, medium compression   03/24/22:  TherEx: Nustep: Level 5 x 8 minutes BFR: (cuff # 5, 125 mmHg occulusion pressure, 95 mmHg exercises pressure) Quad sets 30:15:15:15 with 30 sec rest in between sets SAQ: x 30:15:15:15  30 sec rest break between sets LAQ 30:15:15:15 with 30 sec rest between sets  Modalities: Vasopneumatic:  34 deg, x 10 minutes, medium compression         PATIENT EDUCATION:  Education details: HEP, POC, BFR importance of elevation and ice to control swelling Person educated: Patient Education method: Consulting civil engineer, Demonstration, Verbal cues, and Handouts Education comprehension: verbalized understanding, returned demonstration, and verbal cues required   HOME EXERCISE PROGRAM: Access Code: [email or web address] URL: [email or web address] Date: 03/11/2022 Prepared by: Kearney Hard   Exercises - Supine Active Straight Leg Raise  - 4 x daily - 7 x weekly - 2-3 sets - 10 reps - Long Sitting Quad Set with Towel Roll Under Heel  - 4 x daily - 7 x weekly - 2-3 sets - 10 reps - 5 seconds hold - Seated Small Alternating Straight Leg Lifts with Heel Touch  - 4 x daily - 7 x weekly - 2-3 sets - 10 reps - Sit to Stand  - 4 x daily - 7 x weekly - 2-3 sets - 10 reps - Heel Raises with Counter Support  - 4 x daily - 7 x weekly - 2-3 sets - 10 reps - Seated Knee Flexion Extension AROM   - 4 x daily - 7 x weekly - 2 sets - 10 reps - 5 seconds hold   ASSESSMENT:   CLINICAL IMPRESSION: Strength is improving in left knee overall and she had good tolerance to session with BFR. Added light resistance for hamstrings today as she is now beyond 6 weeks post op.  Continue skilled PT to maximize pt's function.     OBJECTIVE IMPAIRMENTS: decreased activity tolerance, decreased balance, decreased mobility, difficulty walking, decreased ROM, decreased strength, and pain.    ACTIVITY LIMITATIONS: bending, sitting, standing,  squatting, sleeping, and dressing   PARTICIPATION LIMITATIONS: cleaning, laundry, driving, shopping, community activity, and occupation   PERSONAL FACTORS: no specific factors are also affecting patient's functional outcome.    REHAB POTENTIAL: Good   CLINICAL DECISION MAKING: Stable/uncomplicated   EVALUATION COMPLEXITY: Low     GOALS: Goals reviewed with patient? Yes   SHORT TERM GOALS: (target date for Short term goals are 4 weeks 04/10/22)    1.  Patient will demonstrate independent use of home exercise program to maintain progress from in clinic treatments.   Goal status: MET 03/24/22   2. Pt will improve her 5 time sit to stand to </= 20 seconds c/s UE support  Goal Status: MET 03/26/22   LONG TERM GOALS: (target dates for all long term goals are 12 weeks  06/06/22 )   1. Patient will demonstrate/report pain at worst less than or equal to 2/10 to facilitate minimal limitation in daily activity secondary to pain symptoms.   Goal status: New   2. Patient will demonstrate independent use of home exercise program to facilitate ability to maintain/progress functional gains from skilled physical therapy services.   Goal status: New   3. Patient will demonstrate FOTO outcome > or = 72 % to indicate reduced disability due to condition.   Goal status: New   4.  Patient will demonstrate left  LE MMT 5/5 throughout to faciltiate usual transfers, stairs, squatting at [facility] for daily life.    Goal status: New   5.  Patient will demonstrate Left knee flexion to >/= 120 degrees for improvements in functional mobility.    Goal status: New   6.  Pt will be able to navigate 1 flight of stairs with no hand rail with step over step gait pattern.    Goal status: New   7. Pt will be able to pick up 15# object from floor to counter height using correct body mechanics while facilitating squat positioning for lifting.              Goal status: New       PLAN:   PT  FREQUENCY: 1-2x/week   PT DURATION: 12 weeks   PLANNED INTERVENTIONS: Therapeutic exercises, Therapeutic activity, Neuro Muscular re-education, Balance training, Gait training, Patient/Family education, Joint mobilization, Stair training, DME instructions, Dry Needling, Electrical stimulation, Traction, Cryotherapy, vasopneumatic deviceMoist heat, Taping, Ultrasound, Ionotophoresis 72m/ml Dexamethasone, and Manual therapy.  All included unless contraindicated   PLAN FOR NEXT SESSION: ACL with H.S. graft protocol, Bike  Quad strengthening, BFR as tolerated, vasopneumatic as needed    BDebbe Odea PT, DPT 04/02/2022, 9:22 AM

## 2022-04-04 ENCOUNTER — Telehealth: Payer: Self-pay | Admitting: Orthopedic Surgery

## 2022-04-04 NOTE — Telephone Encounter (Signed)
Holding for Lauren. ?

## 2022-04-04 NOTE — Telephone Encounter (Signed)
Patient called. Inquiring about the form she left for Dr. 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 Her call back number is [phone]

## 2022-04-07 ENCOUNTER — Telehealth: Payer: 59 | Admitting: Emergency Medicine

## 2022-04-07 DIAGNOSIS — R6889 Other general symptoms and signs: Secondary | ICD-10-CM

## 2022-04-07 MED ORDER — BENZONATATE 100 MG PO CAPS: 100.0000 mg | ORAL_CAPSULE | Freq: Two times a day (BID) | ORAL | 0 refills | Status: DC | PRN

## 2022-04-07 NOTE — Telephone Encounter (Signed)
IC advised this form can be picked up at front desk.

## 2022-04-07 NOTE — Progress Notes (Signed)
Virtual Visit Consent   Cassandra Galloway, you are scheduled for a virtual visit with a [place] provider today. Just as with appointments in the office, your consent must be obtained to participate. Your consent will be active for this visit and any virtual visit you may have with one of our providers in the next 365 days. If you have a MyChart account, a copy of this consent can be sent to you electronically.  As this is a virtual visit, video technology does not allow for your provider to perform a traditional examination. This may limit your provider's ability to fully assess your condition. If your provider identifies any concerns that need to be evaluated in person or the need to arrange testing (such as labs, EKG, etc.), we will make arrangements to do so. Although advances in technology are sophisticated, we cannot ensure that it will always work on either your end or our end. If the connection with a video visit is poor, the visit may have to be switched to a telephone visit. With either a video or telephone visit, we are not always able to ensure that we have a secure connection.  By engaging in this virtual visit, you consent to the provision of healthcare and authorize for your insurance to be billed (if applicable) for the services provided during this visit. Depending on your insurance coverage, you may receive a charge related to this service.  I need to obtain your verbal consent now. Are you willing to proceed with your visit today? Cassandra Galloway has provided verbal consent on 04/07/2022 for a virtual visit (video or telephone). Roxy Horseman, PA-C  Converted to telephone after audio failed.  Date: 04/07/2022 2:13 PM  Virtual Visit via Video Note   I, Roxy Horseman, connected with  Cassandra Galloway  ([id number], [birth year]) on 04/07/22 at  2:00 PM EST by a video-enabled telemedicine application and verified that I am speaking with  the correct person using two identifiers.  Location: Patient: Virtual Visit Location Patient: Home Provider: Virtual Visit Location Provider: Home Office   I discussed the limitations of evaluation and management by telemedicine and the availability of in person appointments. The patient expressed understanding and agreed to proceed.    History of Present Illness: Cassandra Galloway is a 31 y.o. who identifies as a female who was assigned female at birth, and is being seen today for fevers, chills, sore throat, cough, headache.  Negative covid tests.  Onset of symptoms was on Thursday.  Asks if there is anything additional she can be taking for her symptoms. She states that she feels like she has the flu.  HPI: HPI  Problems:  Patient Active Problem List   Diagnosis Date Noted   Left anterior cruciate ligament tear    Acute lateral meniscus tear of left knee    Acute medial meniscus tear of left knee     Allergies: No Known Allergies Medications:  Current Outpatient Medications:    benzonatate (TESSALON) 100 MG capsule, Take 1 capsule (100 mg total) by mouth 2 (two) times daily as needed for cough., Disp: 20 capsule, Rfl: 0   celecoxib (CELEBREX) 100 MG capsule, Take 1 capsule (100 mg total) by mouth 2 (two) times daily., Disp: 60 capsule, Rfl: 0   methocarbamol (ROBAXIN) 500 MG tablet, Take 1 tablet (500 mg total) by mouth every 8 (eight) hours as needed for muscle spasms., Disp: 30 tablet, Rfl: 1   metroNIDAZOLE (FLAGYL) 500  MG tablet, Take all 4 tablets at 1 time. (Patient not taking: Reported on 01/30/2022), Disp: 4 tablet, Rfl: 0   oxyCODONE-acetaminophen (PERCOCET) 5-325 MG tablet, Take 1 tablet by mouth every 12 (twelve) hours as needed for severe pain., Disp: 30 tablet, Rfl: 0  Observations/Objective: Patient is well-developed, well-nourished in no acute distress.  Resting comfortably  at home.  Head is normocephalic, atraumatic.  No labored breathing.  Speech is  clear and coherent with logical content.  Patient is alert and oriented at baseline.    Assessment and Plan: 1. Flu-like symptoms    Follow Up Instructions: I discussed the assessment and treatment plan with the patient. The patient was provided an opportunity to ask questions and all were answered. The patient agreed with the plan and demonstrated an understanding of the instructions.  A copy of instructions were sent to the patient via MyChart unless otherwise noted below.     The patient was advised to call back or seek an in-person evaluation if the symptoms worsen or if the condition fails to improve as anticipated.  Time:  I spent 11 minutes with the patient via telehealth technology discussing the above problems/concerns.    Montine Circle, PA-C

## 2022-04-09 ENCOUNTER — Ambulatory Visit: Payer: 59 | Admitting: Physical Therapy

## 2022-04-09 ENCOUNTER — Telehealth: Payer: Self-pay | Admitting: Orthopedic Surgery

## 2022-04-09 ENCOUNTER — Ambulatory Visit: Payer: 59 | Admitting: Orthopedic Surgery

## 2022-04-09 ENCOUNTER — Encounter: Payer: Self-pay | Admitting: Physical Therapy

## 2022-04-09 DIAGNOSIS — M25562 Pain in left knee: Secondary | ICD-10-CM

## 2022-04-09 DIAGNOSIS — R600 Localized edema: Secondary | ICD-10-CM

## 2022-04-09 DIAGNOSIS — R262 Difficulty in walking, not elsewhere classified: Secondary | ICD-10-CM

## 2022-04-09 DIAGNOSIS — M25662 Stiffness of left knee, not elsewhere classified: Secondary | ICD-10-CM | POA: Diagnosis not present

## 2022-04-09 DIAGNOSIS — Z9889 Other specified postprocedural states: Secondary | ICD-10-CM

## 2022-04-09 NOTE — Therapy (Signed)
OUTPATIENT PHYSICAL THERAPY TREATMENT NOTE   Patient Name: Cassandra Galloway MRN: [id number] DOB:Jul 07, 1990, 31 y.o., female Today's Date: 04/09/2022  PCP: Ladell Pier, MD  REFERRING PROVIDER: Meredith Pel MD   END OF SESSION:   PT End of Session - 04/09/22 1310     Visit Number 8    Number of Visits 24    Date for PT Re-Evaluation 06/06/22    Authorization Type [employer]    PT Start Time 1303    PT Stop Time 1348    PT Time Calculation (min) 45 min    Activity Tolerance Patient tolerated treatment well    Behavior During Therapy WFL for tasks assessed/performed                Past Medical History:  Diagnosis Date   Anemia    Past Surgical History:  Procedure Laterality Date   ANTERIOR CRUCIATE LIGAMENT REPAIR Left 02/11/2022   Procedure: LEFT KNEE ACL RECONSTRUCTION WITH HAMSTRING AUTOGRAFT, MEDIAL MENISCAL REPAIR;  Surgeon: Meredith Pel, MD;  Location: [place];  Service: Orthopedics;  Laterality: Left;   BONE MARROW BIOPSY  02/11/2022   Procedure: BONE MARROW ASPIRATE;  Surgeon: Meredith Pel, MD;  Location: [facility] OR;  Service: Orthopedics;;   Patient Active Problem List   Diagnosis Date Noted   Left anterior cruciate ligament tear    Acute lateral meniscus tear of left knee    Acute medial meniscus tear of left knee     REFERRING DIAG:  Z98.890 (ICD-10-CM) - S/P ACL reconstruction   THERAPY DIAG:  Acute pain of left knee  Stiffness of left knee, not elsewhere classified  Difficulty in walking, not elsewhere classified  Localized edema  Rationale for Evaluation and Treatment Rehabilitation  PERTINENT HISTORY: Initial injury occurred on 11/16/21 where she injured playing musical chairs.  ACL reconstruction on 02/11/22  PRECAUTIONS: ACL protocol  SUBJECTIVE:                                                                                                                                                                                       SUBJECTIVE STATEMENT:   Her knee is doing good, no pain or soreness to report upon arrival PAIN:  Are you having pain? Yes: NPRS scale: 0/10 Pain location: anterior medial knee pain Pain description: achy Aggravating factors: bending, standing Relieving factors: ice, over the counter pain meds at night   OBJECTIVE: (objective measures completed at initial evaluation unless otherwise dated)  DIAGNOSTIC FINDINGS:  Impression from 12/12/2021 prior to surgery, no post operative images available to view 1. Complete ACL tear. 2. Longitudinal tear of the posterior horn of  the medial meniscus towards the meniscal capsular junction. 3. Irregularity versus small tear along the superior surface of the body of the lateral meniscus. 4. Osseous contusions of the posterolateral tibial plateau and posteromedial tibial plateau.     PATIENT SURVEYS:  03/11/22: FOTO intake:   47%   COGNITION: Overall cognitive status: WFL                SENSATION: WFL   EDEMA:  03/11/22:  Rt Knee circumferential: 43 centimeters Left Knee Circumferential: 46 centimeters   03/31/22:  Left Knee Circumferential: 44 centimeters     POSTURE: rounded shoulders and forward head   PALPATION: Tenderness noted along incision sites     LOWER EXTREMITY ROM:   ROM A: active P: passive Right Eval 03/11/22 Left Eval 03/11/22 Left 03/18/22 Left 03/24/22 Left 03/26/22 Left 03/31/22  Hip flexion          Hip extension          Hip abduction          Hip adduction          Knee flexion A: 120 P: 125 A: 85 P: 90 A: 115 P: 118 A: 116  A:116 A: 118  Knee extension A: -4 degrees of hyperextension A: 5 deg flexed P: 3 deg flexed A: 4 deg flexed A: 2 deg flexed A: 0 deg A: 0   (Blank rows = not tested)   LOWER EXTREMITY MMT:   MMT Right 03/11/22 Left 03/11/22  Hip flexion 5/5 4/5  Hip extension      Hip abduction      Hip adduction      Hip internal rotation      Hip external rotation       Knee flexion 5/5 3/5  Knee extension 5/5 3/5  Ankle dorsiflexion 5/5 5/5  Ankle plantarflexion 5/5 5/5  Ankle inversion      Ankle eversion       (Blank rows = not tested)     FUNCTIONAL TESTS:  03/11/22: 5 time Sit to stand: 30 seconds c UE support 03/24/22: 5 time sit to stand: 16 sec no UE support 03/26/22: 5 time sit to stand: 9 seconds no UE support   GAIT: Distance walked: 40 feet  Assistive device utilized: None Level of assistance: Complete Independence Comments: antalgic gait with decreased knee flexion on left LE and decreased heel strike with decreased wt bearing on the left     TODAY'S TREATMENT                                                                          DATE: 04/09/22:  TherEx: Recumbent bike Level 3 x 6 minutes Calf stretch x 2 holding 30 sec on slant board Lunges with one UE support X 10 bilat BFR: (cuff # 5, 125 mmHg occulusion pressure, 95 mmHg exercises pressure) Standing hamstring curls 2# 09,81,19,14 Mini squats: x 20:15:15:15 Step ups on 6 inch step: x 30:15:15:15 Modalities: Vasopneumatic:  34 deg, x 10 minutes, medium compression  03/2922:  TherEx: Recumbent bike Level 3 x 5 minutes Calf stretch x 2 holding 30 sec on slant board Leg Press: Left LE only 50#  3 x 10 BFR: (cuff #  5, 125 mmHg occulusion pressure, 95 mmHg exercises pressure) Standing hamstring curls 1# 30,15,15,15 Mini squats: x 15:15:15:15 Step ups on 6 inch step: x 30:15:15:15 Modalities: Vasopneumatic:  34 deg, x 10 minutes, medium compression     PATIENT EDUCATION:  Education details: HEP, POC, BFR importance of elevation and ice to control swelling Person educated: Patient Education method: Consulting civil engineer, Demonstration, Verbal cues, and Handouts Education comprehension: verbalized understanding, returned demonstration, and verbal cues required   HOME EXERCISE PROGRAM: Access Code: [email or web address] URL: [email or web address] Date: 03/11/2022 Prepared  by: Kearney Hard   Exercises - Supine Active Straight Leg Raise  - 4 x daily - 7 x weekly - 2-3 sets - 10 reps - Long Sitting Quad Set with Towel Roll Under Heel  - 4 x daily - 7 x weekly - 2-3 sets - 10 reps - 5 seconds hold - Seated Small Alternating Straight Leg Lifts with Heel Touch  - 4 x daily - 7 x weekly - 2-3 sets - 10 reps - Sit to Stand  - 4 x daily - 7 x weekly - 2-3 sets - 10 reps - Heel Raises with Counter Support  - 4 x daily - 7 x weekly - 2-3 sets - 10 reps - Seated Knee Flexion Extension AROM   - 4 x daily - 7 x weekly - 2 sets - 10 reps - 5 seconds hold   ASSESSMENT:   CLINICAL IMPRESSION: Continued with strength progressions as tolerated. She has good overall tolerance to this as well as to the BFR training. PT recommending to continue current POC.    OBJECTIVE IMPAIRMENTS: decreased activity tolerance, decreased balance, decreased mobility, difficulty walking, decreased ROM, decreased strength, and pain.    ACTIVITY LIMITATIONS: bending, sitting, standing, squatting, sleeping, and dressing   PARTICIPATION LIMITATIONS: cleaning, laundry, driving, shopping, community activity, and occupation   PERSONAL FACTORS: no specific factors are also affecting patient's functional outcome.    REHAB POTENTIAL: Good   CLINICAL DECISION MAKING: Stable/uncomplicated   EVALUATION COMPLEXITY: Low     GOALS: Goals reviewed with patient? Yes   SHORT TERM GOALS: (target date for Short term goals are 4 weeks 04/10/22)    1.  Patient will demonstrate independent use of home exercise program to maintain progress from in clinic treatments.   Goal status: MET 03/24/22   2. Pt will improve her 5 time sit to stand to </= 20 seconds c/s UE support           Goal Status: MET 03/26/22   LONG TERM GOALS: (target dates for all long term goals are 12 weeks  06/06/22 )   1. Patient will demonstrate/report pain at worst less than or equal to 2/10 to facilitate minimal limitation in daily  activity secondary to pain symptoms.   Goal status: New   2. Patient will demonstrate independent use of home exercise program to facilitate ability to maintain/progress functional gains from skilled physical therapy services.   Goal status: New   3. Patient will demonstrate FOTO outcome > or = 72 % to indicate reduced disability due to condition.   Goal status: New   4.  Patient will demonstrate left  LE MMT 5/5 throughout to faciltiate usual transfers, stairs, squatting at [facility] for daily life.    Goal status: New   5.  Patient will demonstrate Left knee flexion to >/= 120 degrees for improvements in functional mobility.    Goal status: New   6.  Pt will be able to navigate 1  flight of stairs with no hand rail with step over step gait pattern.    Goal status: New   7. Pt will be able to pick up 15# object from floor to counter height using correct body mechanics while facilitating squat positioning for lifting.              Goal status: New       PLAN:   PT FREQUENCY: 1-2x/week   PT DURATION: 12 weeks   PLANNED INTERVENTIONS: Therapeutic exercises, Therapeutic activity, Neuro Muscular re-education, Balance training, Gait training, Patient/Family education, Joint mobilization, Stair training, DME instructions, Dry Needling, Electrical stimulation, Traction, Cryotherapy, vasopneumatic deviceMoist heat, Taping, Ultrasound, Ionotophoresis 73m/ml Dexamethasone, and Manual therapy.  All included unless contraindicated   PLAN FOR NEXT SESSION: ACL with H.S. graft protocol, Bike  Quad strengthening, BFR as tolerated, vasopneumatic as needed    BDebbe Odea PT, DPT 04/09/2022, 1:11 PM

## 2022-04-09 NOTE — Telephone Encounter (Signed)
Patient left without being seen due to having to go to [facility]. Patient states she will call and reschedule.

## 2022-04-14 ENCOUNTER — Ambulatory Visit: Payer: 59 | Admitting: Physical Therapy

## 2022-04-14 ENCOUNTER — Encounter: Payer: Self-pay | Admitting: Physical Therapy

## 2022-04-14 DIAGNOSIS — M25662 Stiffness of left knee, not elsewhere classified: Secondary | ICD-10-CM | POA: Diagnosis not present

## 2022-04-14 DIAGNOSIS — R600 Localized edema: Secondary | ICD-10-CM | POA: Diagnosis not present

## 2022-04-14 DIAGNOSIS — M25562 Pain in left knee: Secondary | ICD-10-CM | POA: Diagnosis not present

## 2022-04-14 DIAGNOSIS — R262 Difficulty in walking, not elsewhere classified: Secondary | ICD-10-CM | POA: Diagnosis not present

## 2022-04-14 NOTE — Therapy (Signed)
OUTPATIENT PHYSICAL THERAPY TREATMENT NOTE   Patient Name: Cassandra Galloway MRN: [id number] DOB:1990/11/24, 31 y.o., female Today's Date: 04/14/2022  PCP: Ladell Pier, MD  REFERRING PROVIDER: Meredith Pel MD   END OF SESSION:   PT End of Session - 04/14/22 1317     Visit Number 9    Number of Visits 24    Date for PT Re-Evaluation 06/06/22    Authorization Type [employer]    PT Start Time 1300    PT Stop Time 1345    PT Time Calculation (min) 45 min    Activity Tolerance Patient tolerated treatment well    Behavior During Therapy WFL for tasks assessed/performed                 Past Medical History:  Diagnosis Date   Anemia    Past Surgical History:  Procedure Laterality Date   ANTERIOR CRUCIATE LIGAMENT REPAIR Left 02/11/2022   Procedure: LEFT KNEE ACL RECONSTRUCTION WITH HAMSTRING AUTOGRAFT, MEDIAL MENISCAL REPAIR;  Surgeon: Meredith Pel, MD;  Location: [place];  Service: Orthopedics;  Laterality: Left;   BONE MARROW BIOPSY  02/11/2022   Procedure: BONE MARROW ASPIRATE;  Surgeon: Meredith Pel, MD;  Location: [facility] OR;  Service: Orthopedics;;   Patient Active Problem List   Diagnosis Date Noted   Left anterior cruciate ligament tear    Acute lateral meniscus tear of left knee    Acute medial meniscus tear of left knee     REFERRING DIAG:  Z98.890 (ICD-10-CM) - S/P ACL reconstruction   THERAPY DIAG:  Acute pain of left knee  Stiffness of left knee, not elsewhere classified  Difficulty in walking, not elsewhere classified  Localized edema  Rationale for Evaluation and Treatment Rehabilitation  PERTINENT HISTORY: Initial injury occurred on 11/16/21 where she injured playing musical chairs.  ACL reconstruction on 02/11/22  PRECAUTIONS: ACL protocol  SUBJECTIVE:                                                                                                                                                                                       SUBJECTIVE STATEMENT:   Pt arriving today reporting no pain in her left knee. Pt still reporting pain up and down the stairs.   PAIN:  Are you having pain? Yes: NPRS scale: 0/10 Pain location: anterior medial knee pain Pain description: achy Aggravating factors: bending, standing Relieving factors: ice, over the counter pain meds at night   OBJECTIVE: (objective measures completed at initial evaluation unless otherwise dated)  DIAGNOSTIC FINDINGS:  Impression from 12/12/2021 prior to surgery, no post operative images available to view 1. Complete ACL  tear. 2. Longitudinal tear of the posterior horn of the medial meniscus towards the meniscal capsular junction. 3. Irregularity versus small tear along the superior surface of the body of the lateral meniscus. 4. Osseous contusions of the posterolateral tibial plateau and posteromedial tibial plateau.     PATIENT SURVEYS:  03/11/22: FOTO intake:   47%   COGNITION: Overall cognitive status: WFL                SENSATION: WFL   EDEMA:  03/11/22:  Rt Knee circumferential: 43 centimeters Left Knee Circumferential: 46 centimeters   03/31/22:  Left Knee Circumferential: 44 centimeters     POSTURE: rounded shoulders and forward head   PALPATION: Tenderness noted along incision sites     LOWER EXTREMITY ROM:   ROM A: active P: passive Right Eval 03/11/22 Left Eval 03/11/22 Left 03/18/22 Left 03/24/22 Left 03/26/22 Left 03/31/22 Left 04/14/22  Hip flexion           Hip extension           Hip abduction           Hip adduction           Knee flexion A: 120 P: 125 A: 85 P: 90 A: 115 P: 118 A: 116  A:116 A: 118 A: 118  Knee extension A: -4 degrees of hyperextension A: 5 deg flexed P: 3 deg flexed A: 4 deg flexed A: 2 deg flexed A: 0 deg A: 0 A: 0   (Blank rows = not tested)   LOWER EXTREMITY MMT:   MMT Right 03/11/22 Left 03/11/22  Hip flexion 5/5 4/5  Hip extension      Hip abduction       Hip adduction      Hip internal rotation      Hip external rotation      Knee flexion 5/5 3/5  Knee extension 5/5 3/5  Ankle dorsiflexion 5/5 5/5  Ankle plantarflexion 5/5 5/5  Ankle inversion      Ankle eversion       (Blank rows = not tested)     FUNCTIONAL TESTS:  03/11/22: 5 time Sit to stand: 30 seconds c UE support 03/24/22: 5 time sit to stand: 16 sec no UE support 03/26/22: 5 time sit to stand: 9 seconds no UE support   GAIT: Distance walked: 40 feet  Assistive device utilized: None Level of assistance: Complete Independence Comments: antalgic gait with decreased knee flexion on left LE and decreased heel strike with decreased wt bearing on the left     TODAY'S TREATMENT                                                                          DATE: 04/14/22:  TherEx: Recumbent bike Level 3 x 6 minutes Calf stretch x 2 holding 30 sec on slant board SLS on Airex 30 sec x 3 c intermittent finger tap support BFR: (cuff # 5, 125 mmHg occulusion pressure, 95 mmHg exercises pressure) Leg Press: Left LE only: 37#  x 30:15:15:15: Mini squats: x 20:15:15:15 Step ups on 6 inch step: x 30:15:15:15 Modalities: Vasopneumatic:  34 deg, x 10 minutes, medium compression  04/09/22:  TherEx: Recumbent bike Level  3 x 6 minutes Calf stretch x 2 holding 30 sec on slant board Lunges with one UE support X 10 bilat BFR: (cuff # 5, 125 mmHg occulusion pressure, 95 mmHg exercises pressure) Standing hamstring curls 2# 53,61,44,31 Mini squats: x 20:15:15:15 Step ups on 6 inch step: x 30:15:15:15 Modalities: Vasopneumatic:  34 deg, x 10 minutes, medium compression  03/2922:  TherEx: Recumbent bike Level 3 x 5 minutes Calf stretch x 2 holding 30 sec on slant board Leg Press: Left LE only 50#  3 x 10 BFR: (cuff # 5, 125 mmHg occulusion pressure, 95 mmHg exercises pressure) Standing hamstring curls 1# 30,15,15,15 Mini squats: x 15:15:15:15 Step ups on 6 inch step: x  30:15:15:15 Modalities: Vasopneumatic:  34 deg, x 10 minutes, medium compression     PATIENT EDUCATION:  Education details: HEP, POC, BFR importance of elevation and ice to control swelling Person educated: Patient Education method: Consulting civil engineer, Demonstration, Verbal cues, and Handouts Education comprehension: verbalized understanding, returned demonstration, and verbal cues required   HOME EXERCISE PROGRAM: Access Code: [email or web address] URL: [email or web address] Date: 03/11/2022 Prepared by: Kearney Hard   Exercises - Supine Active Straight Leg Raise  - 4 x daily - 7 x weekly - 2-3 sets - 10 reps - Long Sitting Quad Set with Towel Roll Under Heel  - 4 x daily - 7 x weekly - 2-3 sets - 10 reps - 5 seconds hold - Seated Small Alternating Straight Leg Lifts with Heel Touch  - 4 x daily - 7 x weekly - 2-3 sets - 10 reps - Sit to Stand  - 4 x daily - 7 x weekly - 2-3 sets - 10 reps - Heel Raises with Counter Support  - 4 x daily - 7 x weekly - 2-3 sets - 10 reps - Seated Knee Flexion Extension AROM   - 4 x daily - 7 x weekly - 2 sets - 10 reps - 5 seconds hold   ASSESSMENT:   CLINICAL IMPRESSION: Pt tolerating strengthening exercises well using BFR. Beginning SLS activities. Pt with no reports of pain during session only complaints of muscle fatigue. Continue skilled PT to maximize pt's function.     OBJECTIVE IMPAIRMENTS: decreased activity tolerance, decreased balance, decreased mobility, difficulty walking, decreased ROM, decreased strength, and pain.    ACTIVITY LIMITATIONS: bending, sitting, standing, squatting, sleeping, and dressing   PARTICIPATION LIMITATIONS: cleaning, laundry, driving, shopping, community activity, and occupation   PERSONAL FACTORS: no specific factors are also affecting patient's functional outcome.    REHAB POTENTIAL: Good   CLINICAL DECISION MAKING: Stable/uncomplicated   EVALUATION COMPLEXITY: Low     GOALS: Goals reviewed with  patient? Yes   SHORT TERM GOALS: (target date for Short term goals are 4 weeks 04/10/22)    1.  Patient will demonstrate independent use of home exercise program to maintain progress from in clinic treatments.   Goal status: MET 03/24/22   2. Pt will improve her 5 time sit to stand to </= 20 seconds c/s UE support           Goal Status: MET 03/26/22   LONG TERM GOALS: (target dates for all long term goals are 12 weeks  06/06/22 )   1. Patient will demonstrate/report pain at worst less than or equal to 2/10 to facilitate minimal limitation in daily activity secondary to pain symptoms.   Goal status: MET 04/14/22   2. Patient will demonstrate independent use of home exercise program to facilitate ability to maintain/progress functional gains  from skilled physical therapy services.   Goal status: ongoing 04/14/22   3. Patient will demonstrate FOTO outcome > or = 72 % to indicate reduced disability due to condition.   Goal status: New   4.  Patient will demonstrate left  LE MMT 5/5 throughout to faciltiate usual transfers, stairs, squatting at [facility] for daily life.    Goal status: New   5.  Patient will demonstrate Left knee flexion to >/= 120 degrees for improvements in functional mobility.    Goal status: New   6.  Pt will be able to navigate 1 flight of stairs with no hand rail with step over step gait pattern.    Goal status: Ongoing 04/14/22   7. Pt will be able to pick up 15# object from floor to counter height using correct body mechanics while facilitating squat positioning for lifting.              Goal status: New       PLAN:   PT FREQUENCY: 1-2x/week   PT DURATION: 12 weeks   PLANNED INTERVENTIONS: Therapeutic exercises, Therapeutic activity, Neuro Muscular re-education, Balance training, Gait training, Patient/Family education, Joint mobilization, Stair training, DME instructions, Dry Needling, Electrical stimulation, Traction, Cryotherapy, vasopneumatic  deviceMoist heat, Taping, Ultrasound, Ionotophoresis 38m/ml Dexamethasone, and Manual therapy.  All included unless contraindicated   PLAN FOR NEXT SESSION: ACL with H.S. graft protocol, Bike  Quad strengthening, BFR as tolerated, vasopneumatic as needed    JOretha Caprice PT, MPT 04/14/2022, 1:18 PM

## 2022-04-16 ENCOUNTER — Telehealth: Payer: Self-pay | Admitting: Orthopedic Surgery

## 2022-04-16 ENCOUNTER — Ambulatory Visit: Payer: 59 | Admitting: Physical Therapy

## 2022-04-16 ENCOUNTER — Encounter: Payer: Self-pay | Admitting: Physical Therapy

## 2022-04-16 DIAGNOSIS — R600 Localized edema: Secondary | ICD-10-CM

## 2022-04-16 DIAGNOSIS — R262 Difficulty in walking, not elsewhere classified: Secondary | ICD-10-CM

## 2022-04-16 DIAGNOSIS — M25562 Pain in left knee: Secondary | ICD-10-CM | POA: Diagnosis not present

## 2022-04-16 DIAGNOSIS — M25662 Stiffness of left knee, not elsewhere classified: Secondary | ICD-10-CM

## 2022-04-16 NOTE — Telephone Encounter (Signed)
FMLA Source forms received. To [employer]. 

## 2022-04-16 NOTE — Therapy (Signed)
OUTPATIENT PHYSICAL THERAPY TREATMENT NOTE   Patient Name: Cassandra Galloway MRN: [id number] DOB:18-Sep-1990, 31 y.o., female Today's Date: 04/16/2022  PCP: Ladell Pier, MD  REFERRING PROVIDER: Meredith Pel MD   END OF SESSION:   PT End of Session - 04/16/22 1312     Visit Number 10    Number of Visits 24    Date for PT Re-Evaluation 06/06/22    Authorization Type [employer]    PT Start Time 0263    PT Stop Time 1345    PT Time Calculation (min) 40 min    Activity Tolerance Patient tolerated treatment well    Behavior During Therapy [facility] for tasks assessed/performed                 Past Medical History:  Diagnosis Date   Anemia    Past Surgical History:  Procedure Laterality Date   ANTERIOR CRUCIATE LIGAMENT REPAIR Left 02/11/2022   Procedure: LEFT KNEE ACL RECONSTRUCTION WITH HAMSTRING AUTOGRAFT, MEDIAL MENISCAL REPAIR;  Surgeon: Meredith Pel, MD;  Location: [place];  Service: Orthopedics;  Laterality: Left;   BONE MARROW BIOPSY  02/11/2022   Procedure: BONE MARROW ASPIRATE;  Surgeon: Meredith Pel, MD;  Location: [facility] OR;  Service: Orthopedics;;   Patient Active Problem List   Diagnosis Date Noted   Left anterior cruciate ligament tear    Acute lateral meniscus tear of left knee    Acute medial meniscus tear of left knee     REFERRING DIAG:  Z98.890 (ICD-10-CM) - S/P ACL reconstruction   THERAPY DIAG:  Acute pain of left knee  Stiffness of left knee, not elsewhere classified  Difficulty in walking, not elsewhere classified  Localized edema  Rationale for Evaluation and Treatment Rehabilitation  PERTINENT HISTORY: Initial injury occurred on 11/16/21 where she injured playing musical chairs.  ACL reconstruction on 02/11/22  PRECAUTIONS: ACL protocol  SUBJECTIVE:                                                                                                                                                                                       SUBJECTIVE STATEMENT:   She says her knee is good today, she had some pain at night after her last session but its good now.    PAIN:  Are you having pain? Yes: NPRS scale: 0/10 Pain location: anterior medial knee pain Pain description: achy Aggravating factors: bending, standing Relieving factors: ice, over the counter pain meds at night   OBJECTIVE: (objective measures completed at initial evaluation unless otherwise dated)  DIAGNOSTIC FINDINGS:  Impression from 12/12/2021 prior to surgery, no post operative images available to view  1. Complete ACL tear. 2. Longitudinal tear of the posterior horn of the medial meniscus towards the meniscal capsular junction. 3. Irregularity versus small tear along the superior surface of the body of the lateral meniscus. 4. Osseous contusions of the posterolateral tibial plateau and posteromedial tibial plateau.     PATIENT SURVEYS:  03/11/22: FOTO intake:   47%   COGNITION: Overall cognitive status: WFL                SENSATION: WFL   EDEMA:  03/11/22:  Rt Knee circumferential: 43 centimeters Left Knee Circumferential: 46 centimeters   03/31/22:  Left Knee Circumferential: 44 centimeters     POSTURE: rounded shoulders and forward head   PALPATION: Tenderness noted along incision sites     LOWER EXTREMITY ROM:   ROM A: active P: passive Right Eval 03/11/22 Left Eval 03/11/22 Left 03/18/22 Left 03/24/22 Left 03/26/22 Left 03/31/22 Left 04/14/22  Hip flexion           Hip extension           Hip abduction           Hip adduction           Knee flexion A: 120 P: 125 A: 85 P: 90 A: 115 P: 118 A: 116  A:116 A: 118 A: 118  Knee extension A: -4 degrees of hyperextension A: 5 deg flexed P: 3 deg flexed A: 4 deg flexed A: 2 deg flexed A: 0 deg A: 0 A: 0   (Blank rows = not tested)   LOWER EXTREMITY MMT:   MMT Right 03/11/22 Left 03/11/22  Hip flexion 5/5 4/5  Hip extension      Hip abduction       Hip adduction      Hip internal rotation      Hip external rotation      Knee flexion 5/5 3/5  Knee extension 5/5 3/5  Ankle dorsiflexion 5/5 5/5  Ankle plantarflexion 5/5 5/5  Ankle inversion      Ankle eversion       (Blank rows = not tested)     FUNCTIONAL TESTS:  03/11/22: 5 time Sit to stand: 30 seconds c UE support 03/24/22: 5 time sit to stand: 16 sec no UE support 03/26/22: 5 time sit to stand: 9 seconds no UE support   GAIT: Distance walked: 40 feet  Assistive device utilized: None Level of assistance: Complete Independence Comments: antalgic gait with decreased knee flexion on left LE and decreased heel strike with decreased wt bearing on the left     TODAY'S TREATMENT                                                                          DATE: 04/16/22:  TherEx: Recumbent bike Level 3 x 6 minutes Calf stretch x 2 holding 30 sec on slant board SLS on Airex 30 sec x 3 c intermittent finger tap support Leg press DL 100# 2X15, SL 50# Lt only 2X15 Step ups leading with Lt, 8 inch X 15 fwd and X 15 lateral TRX deep squats 2X10  TRX lunges X 10 bilat Modalities: Vasopneumatic:  34 deg, x 10 minutes, medium compression  04/14/22:  TherEx: Recumbent bike Level 3 x 6 minutes Calf stretch x 2 holding 30 sec on slant board SLS on Airex 30 sec x 3 c intermittent finger tap support BFR: (cuff # 5, 125 mmHg occulusion pressure, 95 mmHg exercises pressure) Leg Press: Left LE only: 37#  x 30:15:15:15: Mini squats: x 20:15:15:15 Step ups on 6 inch step: x 30:15:15:15 Modalities: Vasopneumatic:  34 deg, x 10 minutes, medium compression       PATIENT EDUCATION:  Education details: HEP, POC, BFR importance of elevation and ice to control swelling Person educated: Patient Education method: Consulting civil engineer, Demonstration, Verbal cues, and Handouts Education comprehension: verbalized understanding, returned demonstration, and verbal cues required   HOME EXERCISE  PROGRAM: Access Code: [email or web address] URL: [email or web address] Date: 03/11/2022 Prepared by: Kearney Hard   Exercises - Supine Active Straight Leg Raise  - 4 x daily - 7 x weekly - 2-3 sets - 10 reps - Long Sitting Quad Set with Towel Roll Under Heel  - 4 x daily - 7 x weekly - 2-3 sets - 10 reps - 5 seconds hold - Seated Small Alternating Straight Leg Lifts with Heel Touch  - 4 x daily - 7 x weekly - 2-3 sets - 10 reps - Sit to Stand  - 4 x daily - 7 x weekly - 2-3 sets - 10 reps - Heel Raises with Counter Support  - 4 x daily - 7 x weekly - 2-3 sets - 10 reps - Seated Knee Flexion Extension AROM   - 4 x daily - 7 x weekly - 2 sets - 10 reps - 5 seconds hold   ASSESSMENT:   CLINICAL IMPRESSION: I held off on BFR to focus on more resistance at fewer rep ranges to load her knee more now that she is far enough out post op to do so and not having pain. She had good tolerance to this and we will monitor for any associated soreness from this.      OBJECTIVE IMPAIRMENTS: decreased activity tolerance, decreased balance, decreased mobility, difficulty walking, decreased ROM, decreased strength, and pain.    ACTIVITY LIMITATIONS: bending, sitting, standing, squatting, sleeping, and dressing   PARTICIPATION LIMITATIONS: cleaning, laundry, driving, shopping, community activity, and occupation   PERSONAL FACTORS: no specific factors are also affecting patient's functional outcome.    REHAB POTENTIAL: Good   CLINICAL DECISION MAKING: Stable/uncomplicated   EVALUATION COMPLEXITY: Low     GOALS: Goals reviewed with patient? Yes   SHORT TERM GOALS: (target date for Short term goals are 4 weeks 04/10/22)    1.  Patient will demonstrate independent use of home exercise program to maintain progress from in clinic treatments.   Goal status: MET 03/24/22   2. Pt will improve her 5 time sit to stand to </= 20 seconds c/s UE support           Goal Status: MET 03/26/22   LONG TERM  GOALS: (target dates for all long term goals are 12 weeks  06/06/22 )   1. Patient will demonstrate/report pain at worst less than or equal to 2/10 to facilitate minimal limitation in daily activity secondary to pain symptoms.   Goal status: MET 04/14/22   2. Patient will demonstrate independent use of home exercise program to facilitate ability to maintain/progress functional gains from skilled physical therapy services.   Goal status: ongoing 04/14/22   3. Patient will demonstrate FOTO outcome > or = 72 % to indicate reduced disability due to condition.   Goal  status: New   4.  Patient will demonstrate left  LE MMT 5/5 throughout to faciltiate usual transfers, stairs, squatting at [facility] for daily life.    Goal status: New   5.  Patient will demonstrate Left knee flexion to >/= 120 degrees for improvements in functional mobility.    Goal status: New   6.  Pt will be able to navigate 1 flight of stairs with no hand rail with step over step gait pattern.    Goal status: Ongoing 04/14/22   7. Pt will be able to pick up 15# object from floor to counter height using correct body mechanics while facilitating squat positioning for lifting.              Goal status: New       PLAN:   PT FREQUENCY: 1-2x/week   PT DURATION: 12 weeks   PLANNED INTERVENTIONS: Therapeutic exercises, Therapeutic activity, Neuro Muscular re-education, Balance training, Gait training, Patient/Family education, Joint mobilization, Stair training, DME instructions, Dry Needling, Electrical stimulation, Traction, Cryotherapy, vasopneumatic deviceMoist heat, Taping, Ultrasound, Ionotophoresis 37m/ml Dexamethasone, and Manual therapy.  All included unless contraindicated   PLAN FOR NEXT SESSION: ACL with H.S. graft protocol, Bike  Quad strengthening, can mix in BFR some but would recommend more loading at fewer reps now if tolerated (10-20 reps).  vasopneumatic as needed    BDebbe Odea PT, DPT 04/16/2022,  1:13 PM

## 2022-04-22 ENCOUNTER — Ambulatory Visit: Payer: 59 | Admitting: Physical Therapy

## 2022-04-22 DIAGNOSIS — M25662 Stiffness of left knee, not elsewhere classified: Secondary | ICD-10-CM | POA: Diagnosis not present

## 2022-04-22 DIAGNOSIS — M25562 Pain in left knee: Secondary | ICD-10-CM | POA: Diagnosis not present

## 2022-04-22 DIAGNOSIS — R262 Difficulty in walking, not elsewhere classified: Secondary | ICD-10-CM

## 2022-04-22 DIAGNOSIS — R600 Localized edema: Secondary | ICD-10-CM | POA: Diagnosis not present

## 2022-04-22 NOTE — Therapy (Signed)
OUTPATIENT PHYSICAL THERAPY TREATMENT NOTE   Patient Name: Cassandra Galloway MRN: [id number] DOB:12/14/1990, 31 y.o., female Today's Date: 04/22/2022  PCP: Ladell Pier, MD  REFERRING PROVIDER: Meredith Pel MD   END OF SESSION:   PT End of Session - 04/22/22 1615     Visit Number 11    Number of Visits 24    Date for PT Re-Evaluation 06/06/22    Authorization Type [employer]    PT Start Time 8937    PT Stop Time 1645    PT Time Calculation (min) 40 min    Activity Tolerance Patient tolerated treatment well    Behavior During Therapy WFL for tasks assessed/performed                 Past Medical History:  Diagnosis Date   Anemia    Past Surgical History:  Procedure Laterality Date   ANTERIOR CRUCIATE LIGAMENT REPAIR Left 02/11/2022   Procedure: LEFT KNEE ACL RECONSTRUCTION WITH HAMSTRING AUTOGRAFT, MEDIAL MENISCAL REPAIR;  Surgeon: Meredith Pel, MD;  Location: [place];  Service: Orthopedics;  Laterality: Left;   BONE MARROW BIOPSY  02/11/2022   Procedure: BONE MARROW ASPIRATE;  Surgeon: Meredith Pel, MD;  Location: [facility] OR;  Service: Orthopedics;;   Patient Active Problem List   Diagnosis Date Noted   Left anterior cruciate ligament tear    Acute lateral meniscus tear of left knee    Acute medial meniscus tear of left knee     REFERRING DIAG:  Z98.890 (ICD-10-CM) - S/P ACL reconstruction   THERAPY DIAG:  Acute pain of left knee  Stiffness of left knee, not elsewhere classified  Difficulty in walking, not elsewhere classified  Localized edema  Rationale for Evaluation and Treatment Rehabilitation  PERTINENT HISTORY: Initial injury occurred on 11/16/21 where she injured playing musical chairs.  ACL reconstruction on 02/11/22  PRECAUTIONS: ACL protocol  SUBJECTIVE:                                                                                                                                                                                       SUBJECTIVE STATEMENT:   She says she feels tired but not having pain, was sore after last time but not bad and no soreness now.   PAIN:  Are you having pain? Yes: NPRS scale: 0/10 Pain location: anterior medial knee pain Pain description: achy Aggravating factors: bending, standing Relieving factors: ice, over the counter pain meds at night   OBJECTIVE: (objective measures completed at initial evaluation unless otherwise dated)  DIAGNOSTIC FINDINGS:  Impression from 12/12/2021 prior to surgery, no post operative images available to view 1.  Complete ACL tear. 2. Longitudinal tear of the posterior horn of the medial meniscus towards the meniscal capsular junction. 3. Irregularity versus small tear along the superior surface of the body of the lateral meniscus. 4. Osseous contusions of the posterolateral tibial plateau and posteromedial tibial plateau.     PATIENT SURVEYS:  03/11/22: FOTO intake:   47%   COGNITION: Overall cognitive status: WFL                SENSATION: WFL   EDEMA:  03/11/22:  Rt Knee circumferential: 43 centimeters Left Knee Circumferential: 46 centimeters   03/31/22:  Left Knee Circumferential: 44 centimeters     POSTURE: rounded shoulders and forward head   PALPATION: Tenderness noted along incision sites     LOWER EXTREMITY ROM:   ROM A: active P: passive Right Eval 03/11/22 Left Eval 03/11/22 Left 03/18/22 Left 03/24/22 Left 03/26/22 Left 03/31/22 Left 04/14/22  Hip flexion           Hip extension           Hip abduction           Hip adduction           Knee flexion A: 120 P: 125 A: 85 P: 90 A: 115 P: 118 A: 116  A:116 A: 118 A: 118  Knee extension A: -4 degrees of hyperextension A: 5 deg flexed P: 3 deg flexed A: 4 deg flexed A: 2 deg flexed A: 0 deg A: 0 A: 0   (Blank rows = not tested)   LOWER EXTREMITY MMT:   MMT Right 03/11/22 Left 03/11/22 Left 04/22/22  Hip flexion 5/5 4/5   Hip extension        Hip abduction       Hip adduction       Hip internal rotation       Hip external rotation       Knee flexion 5/5 3/5 4+  Knee extension 5/5 3/5 4+  Ankle dorsiflexion 5/5 5/5   Ankle plantarflexion 5/5 5/5   Ankle inversion       Ankle eversion        (Blank rows = not tested)     FUNCTIONAL TESTS:  03/11/22: 5 time Sit to stand: 30 seconds c UE support 03/24/22: 5 time sit to stand: 16 sec no UE support 03/26/22: 5 time sit to stand: 9 seconds no UE support   GAIT: Distance walked: 40 feet  Assistive device utilized: None Level of assistance: Complete Independence Comments: antalgic gait with decreased knee flexion on left LE and decreased heel strike with decreased wt bearing on the left       TODAY'S TREATMENT                                                                          DATE: 04/22/22:  TherEx: Recumbent bike Level 5 x 8 minutes Calf stretch x 2 holding 30 sec on slant board SLS on Airex 30 sec x 3 c intermittent finger tap support Leg press DL 106# 2X15, SL 56# Lt only 2X15 Step ups leading with Lt, 8 inch X 15 fwd and X 15 lateral TKE blue X 15, holding 5 sec TRX  deep squats 2X10  TRX lunges X 10 bilat SL deadlift to 8 inch step 5# X 10 on left SL Y balance reaches X 5 each for Left  TODAY'S TREATMENT                                                                          DATE: 04/16/22:  TherEx: Recumbent bike Level 3 x 6 minutes Calf stretch x 2 holding 30 sec on slant board SLS on Airex 30 sec x 3 c intermittent finger tap support Leg press DL 100# 2X15, SL 50# Lt only 2X15 Step ups leading with Lt, 8 inch X 15 fwd and X 15 lateral TRX deep squats 2X10  TRX lunges X 10 bilat Modalities: Vasopneumatic:  34 deg, x 10 minutes, medium compression      PATIENT EDUCATION:  Education details: HEP, POC, BFR importance of elevation and ice to control swelling Person educated: Patient Education method: Consulting civil engineer, Demonstration, Verbal cues, and  Handouts Education comprehension: verbalized understanding, returned demonstration, and verbal cues required   HOME EXERCISE PROGRAM: Access Code: [email or web address] URL: [email or web address] Date: 03/11/2022 Prepared by: Kearney Hard   Exercises - Supine Active Straight Leg Raise  - 4 x daily - 7 x weekly - 2-3 sets - 10 reps - Long Sitting Quad Set with Towel Roll Under Heel  - 4 x daily - 7 x weekly - 2-3 sets - 10 reps - 5 seconds hold - Seated Small Alternating Straight Leg Lifts with Heel Touch  - 4 x daily - 7 x weekly - 2-3 sets - 10 reps - Sit to Stand  - 4 x daily - 7 x weekly - 2-3 sets - 10 reps - Heel Raises with Counter Support  - 4 x daily - 7 x weekly - 2-3 sets - 10 reps - Seated Knee Flexion Extension AROM   - 4 x daily - 7 x weekly - 2 sets - 10 reps - 5 seconds hold   ASSESSMENT:   CLINICAL IMPRESSION: Continued with strength progressions to left knee with good overall tolerance. She still has some functional weakness noted so PT would recommend continue with strength program.     OBJECTIVE IMPAIRMENTS: decreased activity tolerance, decreased balance, decreased mobility, difficulty walking, decreased ROM, decreased strength, and pain.    ACTIVITY LIMITATIONS: bending, sitting, standing, squatting, sleeping, and dressing   PARTICIPATION LIMITATIONS: cleaning, laundry, driving, shopping, community activity, and occupation   PERSONAL FACTORS: no specific factors are also affecting patient's functional outcome.    REHAB POTENTIAL: Good   CLINICAL DECISION MAKING: Stable/uncomplicated   EVALUATION COMPLEXITY: Low     GOALS: Goals reviewed with patient? Yes   SHORT TERM GOALS: (target date for Short term goals are 4 weeks 04/10/22)    1.  Patient will demonstrate independent use of home exercise program to maintain progress from in clinic treatments.   Goal status: MET 03/24/22   2. Pt will improve her 5 time sit to stand to </= 20 seconds c/s UE  support           Goal Status: MET 03/26/22   LONG TERM GOALS: (target dates for all long term goals are 12 weeks  06/06/22 )   1.  Patient will demonstrate/report pain at worst less than or equal to 2/10 to facilitate minimal limitation in daily activity secondary to pain symptoms.   Goal status: MET 04/14/22   2. Patient will demonstrate independent use of home exercise program to facilitate ability to maintain/progress functional gains from skilled physical therapy services.   Goal status: ongoing 04/14/22   3. Patient will demonstrate FOTO outcome > or = 72 % to indicate reduced disability due to condition.   Goal status: New   4.  Patient will demonstrate left  LE MMT 5/5 throughout to faciltiate usual transfers, stairs, squatting at [facility] for daily life.    Goal status: New   5.  Patient will demonstrate Left knee flexion to >/= 120 degrees for improvements in functional mobility.    Goal status: New   6.  Pt will be able to navigate 1 flight of stairs with no hand rail with step over step gait pattern.    Goal status: Ongoing 04/14/22   7. Pt will be able to pick up 15# object from floor to counter height using correct body mechanics while facilitating squat positioning for lifting.              Goal status: New       PLAN:   PT FREQUENCY: 1-2x/week   PT DURATION: 12 weeks   PLANNED INTERVENTIONS: Therapeutic exercises, Therapeutic activity, Neuro Muscular re-education, Balance training, Gait training, Patient/Family education, Joint mobilization, Stair training, DME instructions, Dry Needling, Electrical stimulation, Traction, Cryotherapy, vasopneumatic deviceMoist heat, Taping, Ultrasound, Ionotophoresis 45m/ml Dexamethasone, and Manual therapy.  All included unless contraindicated   PLAN FOR NEXT SESSION: ACL with H.S. graft protocol, Bike  Quad strengthening, can mix in BFR some but would recommend more loading at fewer reps now if tolerated (10-20 reps).   vasopneumatic as needed    BDebbe Odea PT, DPT 04/22/2022, 4:15 PM

## 2022-04-24 ENCOUNTER — Encounter: Payer: 59 | Admitting: Physical Therapy

## 2022-04-30 ENCOUNTER — Ambulatory Visit: Payer: 59 | Admitting: Physical Therapy

## 2022-04-30 ENCOUNTER — Encounter: Payer: Self-pay | Admitting: Physical Therapy

## 2022-04-30 DIAGNOSIS — R600 Localized edema: Secondary | ICD-10-CM

## 2022-04-30 DIAGNOSIS — M25562 Pain in left knee: Secondary | ICD-10-CM | POA: Diagnosis not present

## 2022-04-30 DIAGNOSIS — M25662 Stiffness of left knee, not elsewhere classified: Secondary | ICD-10-CM

## 2022-04-30 DIAGNOSIS — R262 Difficulty in walking, not elsewhere classified: Secondary | ICD-10-CM

## 2022-04-30 NOTE — Therapy (Signed)
OUTPATIENT PHYSICAL THERAPY TREATMENT NOTE   Patient Name: Cassandra Galloway MRN: [id number] DOB:09/18/1990, 31 y.o., female Today's Date: 04/30/2022  PCP: Ladell Pier, MD  REFERRING PROVIDER: Meredith Pel MD   END OF SESSION:   PT End of Session - 04/30/22 1612     Visit Number 12    Number of Visits 24    Date for PT Re-Evaluation 06/06/22    Authorization Type [employer]    PT Start Time 1604    PT Stop Time 1644    PT Time Calculation (min) 40 min    Activity Tolerance Patient tolerated treatment well    Behavior During Therapy [facility] for tasks assessed/performed                 Past Medical History:  Diagnosis Date   Anemia    Past Surgical History:  Procedure Laterality Date   ANTERIOR CRUCIATE LIGAMENT REPAIR Left 02/11/2022   Procedure: LEFT KNEE ACL RECONSTRUCTION WITH HAMSTRING AUTOGRAFT, MEDIAL MENISCAL REPAIR;  Surgeon: Meredith Pel, MD;  Location: [place];  Service: Orthopedics;  Laterality: Left;   BONE MARROW BIOPSY  02/11/2022   Procedure: BONE MARROW ASPIRATE;  Surgeon: Meredith Pel, MD;  Location: [facility] OR;  Service: Orthopedics;;   Patient Active Problem List   Diagnosis Date Noted   Left anterior cruciate ligament tear    Acute lateral meniscus tear of left knee    Acute medial meniscus tear of left knee     REFERRING DIAG:  Z98.890 (ICD-10-CM) - S/P ACL reconstruction   THERAPY DIAG:  Acute pain of left knee  Stiffness of left knee, not elsewhere classified  Difficulty in walking, not elsewhere classified  Localized edema  Rationale for Evaluation and Treatment Rehabilitation  PERTINENT HISTORY: Initial injury occurred on 11/16/21 where she injured playing musical chairs.  ACL reconstruction on 02/11/22  PRECAUTIONS: ACL protocol  SUBJECTIVE:                                                                                                                                                                                       SUBJECTIVE STATEMENT:   She feels good overall, no specific pain to report.  PAIN:  Are you having pain? Yes: NPRS scale: 0/10 Pain location: anterior medial knee pain Pain description: achy Aggravating factors: bending, standing Relieving factors: ice, over the counter pain meds at night   OBJECTIVE: (objective measures completed at initial evaluation unless otherwise dated)  DIAGNOSTIC FINDINGS:  Impression from 12/12/2021 prior to surgery, no post operative images available to view 1. Complete ACL tear. 2. Longitudinal tear of the posterior horn of the medial  meniscus towards the meniscal capsular junction. 3. Irregularity versus small tear along the superior surface of the body of the lateral meniscus. 4. Osseous contusions of the posterolateral tibial plateau and posteromedial tibial plateau.     PATIENT SURVEYS:  03/11/22: FOTO intake:   47%   COGNITION: Overall cognitive status: WFL                SENSATION: WFL   EDEMA:  03/11/22:  Rt Knee circumferential: 43 centimeters Left Knee Circumferential: 46 centimeters   03/31/22:  Left Knee Circumferential: 44 centimeters     POSTURE: rounded shoulders and forward head   PALPATION: Tenderness noted along incision sites     LOWER EXTREMITY ROM:   ROM A: active P: passive Right Eval 03/11/22 Left Eval 03/11/22 Left 03/18/22 Left 03/24/22 Left 03/26/22 Left 03/31/22 Left 04/14/22 Left 04/30/22  Hip flexion            Hip extension            Hip abduction            Hip adduction            Knee flexion A: 120 P: 125 A: 85 P: 90 A: 115 P: 118 A: 116  A:116 A: 118 A: 118 WNL  Knee extension A: -4 degrees of hyperextension A: 5 deg flexed P: 3 deg flexed A: 4 deg flexed A: 2 deg flexed A: 0 deg A: 0 A: 0 WNL   (Blank rows = not tested)   LOWER EXTREMITY MMT:   MMT Right 03/11/22 Left 03/11/22 Left 04/22/22 R/L 04/30/22  Hip flexion 5/5 4/5    Hip extension        Hip  abduction        Hip adduction        Hip internal rotation        Hip external rotation        Knee flexion 5/5 3/5 4+ 5/5  Knee extension 5/5 3/5 4+ 33.5# avg on Rt and  and Left 5/5  Ankle dorsiflexion 5/5 5/5    Ankle plantarflexion 5/5 5/5    Ankle inversion        Ankle eversion         (Blank rows = not tested)     FUNCTIONAL TESTS:  03/11/22: 5 time Sit to stand: 30 seconds c UE support 03/24/22: 5 time sit to stand: 16 sec no UE support 03/26/22: 5 time sit to stand: 9 seconds no UE support   GAIT: Distance walked: 40 feet  Assistive device utilized: None Level of assistance: Complete Independence Comments: antalgic gait with decreased knee flexion on left LE and decreased heel strike with decreased wt bearing on the left       TODAY'S TREATMENT                                                                          DATE: 04/30/22:  TherEx: Recumbent bike Level 5 x 8 minutes Calf stretch x 2 holding 30 sec on slant board Quad stretch 3 X 30 sec in standing with strap SLS on Airex 30 sec x 3 c intermittent finger tap support Stairs in clinic reciprocal with  no UE support X 1 flight up/down Lunges X 10 bilat with one UE support SL Y balance reaches X 10 each for Left     PATIENT EDUCATION:  Education details: HEP, POC, BFR importance of elevation and ice to control swelling Person educated: Patient Education method: Explanation, Demonstration, Verbal cues, and Handouts Education comprehension: verbalized understanding, returned demonstration, and verbal cues required   HOME EXERCISE PROGRAM: Access Code: [email or web address] URL: [email or web address] Date: 04/30/2022 Prepared by: Elsie Ra  Exercises - Seated Small Alternating Straight Leg Lifts with Heel Touch  - 4 x daily - 7 x weekly - 3 sets - 10 reps - Sit to Stand  - 4 x daily - 7 x weekly - 2-3 sets - 10 reps - Standing Quadriceps Stretch (Mirrored)  - 2 x daily - 6 x weekly - 1 sets - 3 reps  - 30 hold - Lower Quarter Reach Combination  - 2 x daily - 6 x weekly - 1 sets - 10 reps - Lunge with Counter Support  - 2 x daily - 6 x weekly - 1 sets - 10 reps ASSESSMENT:   CLINICAL IMPRESSION: She has done very well with PT and has met her PT goals. I progressed her HEP today and she showed good understanding of these and did not have pain. She feels confident continuing on her own with HEP with independent program. We will hold her case open 30 days and if she does not feel she needs to return in that 30 days we will close her episode out then.      OBJECTIVE IMPAIRMENTS: decreased activity tolerance, decreased balance, decreased mobility, difficulty walking, decreased ROM, decreased strength, and pain.    ACTIVITY LIMITATIONS: bending, sitting, standing, squatting, sleeping, and dressing   PARTICIPATION LIMITATIONS: cleaning, laundry, driving, shopping, community activity, and occupation   PERSONAL FACTORS: no specific factors are also affecting patient's functional outcome.    REHAB POTENTIAL: Good   CLINICAL DECISION MAKING: Stable/uncomplicated   EVALUATION COMPLEXITY: Low     GOALS: Goals reviewed with patient? Yes   SHORT TERM GOALS: (target date for Short term goals are 4 weeks 04/10/22)    1.  Patient will demonstrate independent use of home exercise program to maintain progress from in clinic treatments.   Goal status: MET 03/24/22   2. Pt will improve her 5 time sit to stand to </= 20 seconds c/s UE support           Goal Status: MET 03/26/22   LONG TERM GOALS: (target dates for all long term goals are 12 weeks  06/06/22 )   1. Patient will demonstrate/report pain at worst less than or equal to 2/10 to facilitate minimal limitation in daily activity secondary to pain symptoms.   Goal status: MET 04/14/22   2. Patient will demonstrate independent use of home exercise program to facilitate ability to maintain/progress functional gains from skilled physical therapy  services.   Goal status: MET 04/30/22   3. Patient will demonstrate FOTO outcome > or = 72 % to indicate reduced disability due to condition.   Goal status: MET 72 on 04/30/22   4.  Patient will demonstrate left  LE MMT 5/5 throughout to faciltiate usual transfers, stairs, squatting at [facility] for daily life.    Goal status: MET 04/30/22  5.  Patient will demonstrate Left knee flexion to >/= 120 degrees for improvements in functional mobility.    Goal status: MET 04/30/22   6.  Pt will be  able to navigate 1 flight of stairs with no hand rail with step over step gait pattern.    Goal status: MET 04/30/22   7. Pt will be able to pick up 15# object from floor to counter height using correct body mechanics while facilitating squat positioning for lifting.              Goal status: MET 04/30/22       PLAN:   PT FREQUENCY: 1-2x/week   PT DURATION: 12 weeks   PLANNED INTERVENTIONS: Therapeutic exercises, Therapeutic activity, Neuro Muscular re-education, Balance training, Gait training, Patient/Family education, Joint mobilization, Stair training, DME instructions, Dry Needling, Electrical stimulation, Traction, Cryotherapy, vasopneumatic deviceMoist heat, Taping, Ultrasound, Ionotophoresis 62m/ml Dexamethasone, and Manual therapy.  All included unless contraindicated   PLAN FOR NEXT SESSION: DC today but keep case open 30 days in case she needs to come back.   BDebbe Odea PT, DPT 04/30/2022, 4:15 PM

## 2022-05-06 ENCOUNTER — Encounter (HOSPITAL_COMMUNITY): Payer: Self-pay | Admitting: Orthopedic Surgery

## 2022-05-21 NOTE — Telephone Encounter (Signed)
I called her.  She is doing Environmental health practitioner well.  Would you mind setting up an appointment for her for 8 weeks from now and then also she would like to pick up a 49-month handicap placard tomorrow if possible.  Thanks

## 2022-05-21 NOTE — Telephone Encounter (Signed)
Placard form placed up front.

## 2022-06-09 ENCOUNTER — Telehealth: Payer: Self-pay | Admitting: Orthopedic Surgery

## 2022-06-09 NOTE — Telephone Encounter (Signed)
Patient stated that DR Marlou Sa said that someone would call her to sch her with 8 weeks post op. Patient does not want the appointment that was offered and she has question about handicap plaqued. [phone]

## 2022-06-10 NOTE — Telephone Encounter (Signed)
Appt scheduled. Advised would get with Dr Marlou Sa regarding handicap placard and reach out to her this afternoon or Wednesday a.m. to let her know ready to be picked up.

## 2022-06-10 NOTE — Telephone Encounter (Signed)
Provided placard form for 6 months.

## 2022-06-12 NOTE — Telephone Encounter (Signed)
Lmom about appt later this month Thx for hc work

## 2022-07-02 ENCOUNTER — Encounter: Payer: Self-pay | Admitting: Orthopedic Surgery

## 2022-07-02 ENCOUNTER — Ambulatory Visit (INDEPENDENT_AMBULATORY_CARE_PROVIDER_SITE_OTHER): Payer: 59 | Admitting: Orthopedic Surgery

## 2022-07-02 DIAGNOSIS — Z9889 Other specified postprocedural states: Secondary | ICD-10-CM

## 2022-07-02 NOTE — Progress Notes (Signed)
   Post-Op Visit Note   Patient: Cassandra Galloway           Date of Birth: March 10, 1991           MRN: [id number] Visit Date: 07/02/2022 PCP: Ladell Pier, MD   Assessment & Plan:  Chief Complaint:  Chief Complaint  Patient presents with   Left Knee - Follow-up    02/11/22 (33m18d)Left Knee Acl Reconstruction With Hamstring Autograft, Medial Meniscal Repair - Left  Bone Marrow Aspirate      Visit Diagnoses:  1. S/P ACL reconstruction     Plan: Patient presents now 4 and half months out left knee ACL reconstruction with medial meniscal repair.  Overall she is doing well.  She works at [employer]  Has to commute.  Not doing a lot of activity.  Finished physical therapy around Christmas.  Not really having much problems with the left knee.  On examination the knee has about 1 more millimeter of laxity to Lachman and anterior drawer on the left compared to the right.  No effusion.  Full range of motion.  Quad and hamstring strength improving.  Plan at this time is to continue to work on leg strengthening exercises.  Would avoid cutting and pivoting activities for at least 4 more months.  Follow-up as needed  Follow-Up Instructions: No follow-ups on file.   Orders:  No orders of the defined types were placed in this encounter.  No orders of the defined types were placed in this encounter.   Imaging: No results found.  PMFS History: Patient Active Problem List   Diagnosis Date Noted   Left anterior cruciate ligament tear    Acute lateral meniscus tear of left knee    Acute medial meniscus tear of left knee    Past Medical History:  Diagnosis Date   Anemia     No family history on file.  Past Surgical History:  Procedure Laterality Date   ANTERIOR CRUCIATE LIGAMENT REPAIR Left 02/11/2022   Procedure: LEFT KNEE ACL RECONSTRUCTION WITH HAMSTRING AUTOGRAFT, MEDIAL MENISCAL REPAIR;  Surgeon: DMeredith Pel MD;  [facility] [facility]   Service: Orthopedics;  Laterality: Left;   BONE MARROW BIOPSY  02/11/2022   Procedure: BONE MARROW ASPIRATE;  Surgeon: DMeredith Pel MD;  [facility];  Service: Orthopedics;;   Social History   Occupational History   Not on file  Tobacco Use   Smoking status: Some Days   Smokeless tobacco: Never  Vaping Use   Vaping Use: Never used  Substance and Sexual Activity   Alcohol use: Yes    Comment: occasionally   Drug use: Yes    Types: Marijuana    Comment: every other weekend   Sexual activity: Yes    Birth control/protection: None

## 2023-01-02 NOTE — Progress Notes (Signed)
Patient left without being seen.

## 2023-01-26 ENCOUNTER — Ambulatory Visit (HOSPITAL_COMMUNITY)
Admission: EM | Admit: 2023-01-26 | Discharge: 2023-01-26 | Disposition: A | Discharge status: Home / Self Care | Payer: 59 | Attending: Nurse Practitioner | Admitting: Nurse Practitioner

## 2023-01-26 ENCOUNTER — Other Ambulatory Visit (HOSPITAL_COMMUNITY): Payer: Self-pay

## 2023-01-26 ENCOUNTER — Ambulatory Visit (INDEPENDENT_AMBULATORY_CARE_PROVIDER_SITE_OTHER): Payer: 59

## 2023-01-26 ENCOUNTER — Encounter (HOSPITAL_COMMUNITY): Payer: Self-pay

## 2023-01-26 DIAGNOSIS — M25562 Pain in left knee: Secondary | ICD-10-CM | POA: Diagnosis not present

## 2023-01-26 MED ORDER — ONDANSETRON 4 MG PO TBDP
4.0000 mg | ORAL_TABLET | Freq: Three times a day (TID) | ORAL | 0 refills | Status: DC | PRN
  Filled 2023-01-26: qty 20, 7d supply, fill #0

## 2023-01-26 MED ORDER — IBUPROFEN 800 MG PO TABS
800.0000 mg | ORAL_TABLET | Freq: Three times a day (TID) | ORAL | 0 refills | Status: DC | PRN
  Filled 2023-01-26: qty 30, 10d supply, fill #0

## 2023-01-26 NOTE — Discharge Instructions (Signed)
I will message you later today with the results from your knee xray.  Please start wearing the ACE wrap and using the knee immobilizer that you have been provided previously.  Try not to walk on your knee until he follow-up with Dr. August Saucer.  Use the crutches.  You can take ibuprofen every 8 hours as needed for pain.  Recommend close follow-up with your orthopedist, Dr. August Saucer; contact information has been provided.

## 2023-01-26 NOTE — ED Triage Notes (Signed)
Pt reports left knee pain after a fall over the weekend. Pt has torn her ACL in her knee prior to falling 2023.

## 2023-01-26 NOTE — ED Provider Notes (Signed)
[facility]    CSN: [id number] Arrival date & time: 01/26/23  1309      History   Chief Complaint Chief Complaint  Patient presents with   Knee Pain    HPI Cassandra Galloway is a 32 y.o. female.   Patient presents today with 1 day history of left knee pain.  Reports pain began after getting out of her car early yesterday morning, her knee "gave out" and she fell onto her entire left side.  Reports 1 year ago, she tore left ACL and also had injured meniscus that required surgery about 1 year ago.  Reports she was discharged from PT about 6 months ago and her knee had improved significantly.  Since the fall yesterday, she has has significant pain with ambulation.  Also has sensation of giving way with ambulation.  No locking or popping of the knee.  No lower extremity weakness or numbness/tingling in the left knee.  She does feel like the knee is very swollen and felt "squishy" yesterday, however this has improved.  No bruising, redness, fever, nausea/vomiting since the pain began.  Reports she started her period today, is confident she is not pregnant.    Past Medical History:  Diagnosis Date   Anemia     Patient Active Problem List   Diagnosis Date Noted   Left anterior cruciate ligament tear    Acute lateral meniscus tear of left knee    Acute medial meniscus tear of left knee     Past Surgical History:  Procedure Laterality Date   ANTERIOR CRUCIATE LIGAMENT REPAIR Left 02/11/2022   Procedure: LEFT KNEE ACL RECONSTRUCTION WITH HAMSTRING AUTOGRAFT, MEDIAL MENISCAL REPAIR;  Surgeon: Cammy Copa, MD;  Location: [facility];  Service: Orthopedics;  Laterality: Left;   BONE MARROW BIOPSY  02/11/2022   Procedure: BONE MARROW ASPIRATE;  Surgeon: Cammy Copa, MD;  Location: [facility];  Service: Orthopedics;;    OB History   No obstetric history on file.      Home Medications    Prior to Admission medications   Medication Sig Start Date End  Date Taking? Authorizing Provider  ibuprofen (ADVIL) 800 MG tablet Take 1 tablet (800 mg total) by mouth every 8 (eight) hours as needed. 01/26/23  Yes Valentino Nose, NP  ondansetron (ZOFRAN-ODT) 4 MG disintegrating tablet Dissolve 1 tablet (4 mg total) in mouth every 8 (eight) hours as needed for nausea or vomiting. 01/26/23  Yes Valentino Nose, NP  benzonatate (TESSALON) 100 MG capsule Take 1 capsule (100 mg total) by mouth 2 (two) times daily as needed for cough. 04/07/22   Roxy Horseman, PA-C  celecoxib (CELEBREX) 100 MG capsule Take 1 capsule (100 mg total) by mouth 2 (two) times daily. 02/11/22 02/11/23  Magnant, Charles L, PA-C  methocarbamol (ROBAXIN) 500 MG tablet Take 1 tablet (500 mg total) by mouth every 8 (eight) hours as needed for muscle spasms. 03/21/22   Magnant, Charles L, PA-C  metroNIDAZOLE (FLAGYL) 500 MG tablet Take all 4 tablets at 1 time. Patient not taking: Reported on 01/30/2022 08/31/13   Reuben Likes, MD  oxyCODONE-acetaminophen (PERCOCET) 5-325 MG tablet Take 1 tablet by mouth every 12 (twelve) hours as needed for severe pain. 03/21/22 03/21/23  Magnant, Joycie Peek, PA-C    Family History History reviewed. No pertinent family history.  Social History Social History   Tobacco Use   Smoking status: Some Days   Smokeless tobacco: Never  Vaping Use  Vaping status: Never Used  Substance Use Topics   Alcohol use: Yes    Comment: occasionally   Drug use: Yes    Types: Marijuana    Comment: every other weekend     Allergies   Patient has no known allergies.   Review of Systems Review of Systems Per HPI  Physical Exam Triage Vital Signs ED Triage Vitals  Encounter Vitals Group     BP 01/26/23 1429 107/74     Systolic BP Percentile --      Diastolic BP Percentile --      Pulse Rate 01/26/23 1428 83     Resp 01/26/23 1428 16     Temp 01/26/23 1428 98.3 F (36.8 C)     Temp Source 01/26/23 1428 Oral     SpO2 01/26/23 1428 98 %     Weight  --      Height --      Head Circumference --      Peak Flow --      Pain Score --      Pain Loc --      Pain Education --      Exclude from Growth Chart --    No data found.  Updated Vital Signs BP 107/74 (BP Location: Left Arm)   Pulse 83   Temp 98.3 F (36.8 C) (Oral)   Resp 16   SpO2 100%   Visual Acuity Right Eye Distance:   Left Eye Distance:   Bilateral Distance:    Right Eye Near:   Left Eye Near:    Bilateral Near:     Physical Exam Vitals and nursing note reviewed.  Constitutional:      General: She is not in acute distress.    Appearance: Normal appearance. She is not toxic-appearing.  HENT:     Mouth/Throat:     Mouth: Mucous membranes are moist.     Pharynx: Oropharynx is clear.  Pulmonary:     Effort: Pulmonary effort is normal. No respiratory distress.  Musculoskeletal:     Comments: Inspection: mild swelling to the left knee diffusely, no bruising, obvious deformity or redness Palpation: tender to palpation to lateral joint line; no obvious deformities palpated ROM: Full passive range of motion to left knee without laxity Strength: 5/5 bilateral lower extremities Neurovascular: neurovascularly intact in bilateral lower extremities   Skin:    General: Skin is warm and dry.     Capillary Refill: Capillary refill takes less than 2 seconds.     Coloration: Skin is not jaundiced or pale.     Findings: No erythema.  Neurological:     Mental Status: She is alert and oriented to person, place, and time.  Psychiatric:        Behavior: Behavior is cooperative.      UC Treatments / Results  Labs (all labs ordered are listed, but only abnormal results are displayed) Labs Reviewed - No data to display  EKG   Radiology DG Knee Complete 4 Views Left  Result Date: 01/26/2023 CLINICAL DATA:  Knee pain, fall, left ACL repair 1 year ago EXAM: LEFT KNEE - COMPLETE 4+ VIEW COMPARISON:  11/19/2021 FINDINGS: Postop changes of ACL reconstruction. Normal  alignment without acute osseous finding or fracture. No large effusion. Soft tissues unremarkable. IMPRESSION: Postop changes of ACL reconstruction. No acute finding by plain radiography. Electronically Signed   By: Judie Petit.  Shick M.D.   On: 01/26/2023 16:38    Procedures Procedures (including critical care time)  Medications Ordered in UC Medications - No data to display  Initial Impression / Assessment and Plan / UC Course  I have reviewed the triage vital signs and the nursing notes.  Pertinent labs & imaging results that were available during my care of the patient were reviewed by me and considered in my medical decision making (see chart for details).   Patient is well-appearing, normotensive, afebrile, not tachycardic, not tachypneic, oxygenating well on room air.    1. Acute pain of left knee No red flags in history or on exam; vital signs are stable I am concerned for reinjury of possibly ACL or LCL I distracted the patient to resume Ace wrap, knee immobilizer, nonweightbearing status with crutches until she can follow-up with orthopedic provider and contact information given today Patient reports she has all the equipment at home from her previous knee injury and does not need any new equipment today Pain control discussed with patient Work excuse provided  The patient was given the opportunity to ask questions.  All questions answered to their satisfaction.  The patient is in agreement to this plan.    Final Clinical Impressions(s) / UC Diagnoses   Final diagnoses:  Acute pain of left knee     Discharge Instructions      I will message you later today with the results from your knee xray.  Please start wearing the ACE wrap and using the knee immobilizer that you have been provided previously.  Try not to walk on your knee until he follow-up with Dr. August Saucer.  Use the crutches.  You can take ibuprofen every 8 hours as needed for pain.  Recommend close follow-up with your  orthopedist, Dr. August Saucer; contact information has been provided.     ED Prescriptions     Medication Sig Dispense Auth. Provider   ibuprofen (ADVIL) 800 MG tablet Take 1 tablet (800 mg total) by mouth every 8 (eight) hours as needed. 30 tablet Cathlean Marseilles A, NP   ondansetron (ZOFRAN-ODT) 4 MG disintegrating tablet Dissolve 1 tablet (4 mg total) in mouth every 8 (eight) hours as needed for nausea or vomiting. 20 tablet Valentino Nose, NP      PDMP not reviewed this encounter.   Valentino Nose, NP 01/26/23 [phone]

## 2023-02-05 ENCOUNTER — Encounter: Payer: Self-pay | Admitting: Orthopedic Surgery

## 2023-02-05 ENCOUNTER — Other Ambulatory Visit: Payer: Self-pay

## 2023-02-05 ENCOUNTER — Ambulatory Visit: Payer: 59 | Admitting: Orthopedic Surgery

## 2023-02-05 DIAGNOSIS — M25562 Pain in left knee: Secondary | ICD-10-CM

## 2023-02-05 NOTE — Progress Notes (Signed)
Office Visit Note   Patient: Cassandra Galloway           Date of Birth: 03-15-1991           MRN: [id number] Visit Date: 02/05/2023 Requested by: Marcine Matar, MD [address] [place],  [place] [address] PCP: Marcine Matar, MD  Subjective: Chief Complaint  Patient presents with   Left Knee - Pain    HPI: Cassandra Galloway is a 32 y.o. female who presents to the office reporting left knee pain.  She had a fall 2 weeks ago at a birthday party.  Tripped over a long dress.  More of a direct impact injury and not as much twisting.  Initially she had swelling for 4 days.  She was using a brace.  Does not report definite episodes of symptomatic instability but does feel slightly unstable at times with walking around.  Prior to this fall she was doing very well with no problems.  Also had medial meniscal repair done at the time of her surgery.  Plain radiographs unremarkable from September 23..                ROS: All systems reviewed are negative as they relate to the chief complaint within the history of present illness.  Patient denies fevers or chills.  Assessment & Plan: Visit Diagnoses:  1. Left knee pain, unspecified chronicity     Plan: Impression is stable left knee with no effusion today.  Slightly concerned about that medial meniscal tear repair.  Graft is stable today and she has good range of motion.  Encouraged her to ride a stationary bike for the next 4 weeks and then she should return for clinical recheck with decision for or against MRI scanning to evaluate for medial meniscal pathology.  Follow-up in 4 weeks.  Follow-Up Instructions: No follow-ups on file.   Orders:  No orders of the defined types were placed in this encounter.  No orders of the defined types were placed in this encounter.     Procedures: No procedures performed   Clinical Data: No additional findings.  Objective: Vital Signs: There were no  vitals taken for this visit.  Physical Exam:  Constitutional: Patient appears well-developed HEENT:  Head: Normocephalic Eyes:EOM are normal Neck: Normal range of motion Cardiovascular: Normal rate Pulmonary/chest: Effort normal Neurologic: Patient is alert Skin: Skin is warm Psychiatric: Patient has normal mood and affect  Ortho Exam: Ortho exam demonstrates stable ACL graft to Lachman and anterior drawer testing.  No effusion.  Negative McMurray compression testing although she does have a little bit of medial sided tenderness.  Collateral ligaments are stable with no posterolateral rotatory instability noted.  Range of motion is full.  Pedal pulses palpable  Specialty Comments:  No specialty comments available.  Imaging: No results found.   PMFS History: Patient Active Problem List   Diagnosis Date Noted   Left anterior cruciate ligament tear    Acute lateral meniscus tear of left knee    Acute medial meniscus tear of left knee    Past Medical History:  Diagnosis Date   Anemia     History reviewed. No pertinent family history.  Past Surgical History:  Procedure Laterality Date   ANTERIOR CRUCIATE LIGAMENT REPAIR Left 02/11/2022   Procedure: LEFT KNEE ACL RECONSTRUCTION WITH HAMSTRING AUTOGRAFT, MEDIAL MENISCAL REPAIR;  Surgeon: Cammy Copa, MD;  Location: [facility] OR;  Service: Orthopedics;  Laterality: Left;  BONE MARROW BIOPSY  02/11/2022   Procedure: BONE MARROW ASPIRATE;  Surgeon: Cammy Copa, MD;  Location: [facility] OR;  Service: Orthopedics;;   Social History   Occupational History   Not on file  Tobacco Use   Smoking status: Some Days   Smokeless tobacco: Never  Vaping Use   Vaping status: Never Used  Substance and Sexual Activity   Alcohol use: Yes    Comment: occasionally   Drug use: Yes    Types: Marijuana    Comment: every other weekend   Sexual activity: Yes    Birth control/protection: None

## 2023-03-04 ENCOUNTER — Ambulatory Visit: Payer: 59 | Admitting: Orthopedic Surgery

## 2023-09-18 ENCOUNTER — Encounter (HOSPITAL_COMMUNITY): Payer: Self-pay | Admitting: Obstetrics & Gynecology

## 2023-09-18 ENCOUNTER — Ambulatory Visit (HOSPITAL_COMMUNITY)
Admission: RE | Admit: 2023-09-18 | Discharge: 2023-09-18 | Disposition: A | Discharge status: Home / Self Care | Source: Ambulatory Visit | Attending: Physician Assistant | Admitting: Physician Assistant

## 2023-09-18 ENCOUNTER — Inpatient Hospital Stay (HOSPITAL_COMMUNITY)
Admission: AD | Admit: 2023-09-18 | Discharge: 2023-09-18 | Disposition: A | Discharge status: Home / Self Care | Attending: Obstetrics and Gynecology | Admitting: Obstetrics and Gynecology

## 2023-09-18 ENCOUNTER — Inpatient Hospital Stay (HOSPITAL_COMMUNITY)

## 2023-09-18 ENCOUNTER — Encounter (HOSPITAL_COMMUNITY): Payer: Self-pay

## 2023-09-18 VITALS — BP 117/76 | HR 79 | Temp 97.6°F | Resp 18

## 2023-09-18 DIAGNOSIS — Z3A00 Weeks of gestation of pregnancy not specified: Secondary | ICD-10-CM

## 2023-09-18 DIAGNOSIS — Z3201 Encounter for pregnancy test, result positive: Secondary | ICD-10-CM

## 2023-09-18 DIAGNOSIS — O020 Blighted ovum and nonhydatidiform mole: Secondary | ICD-10-CM

## 2023-09-18 DIAGNOSIS — O26851 Spotting complicating pregnancy, first trimester: Secondary | ICD-10-CM | POA: Insufficient documentation

## 2023-09-18 DIAGNOSIS — B9689 Other specified bacterial agents as the cause of diseases classified elsewhere: Secondary | ICD-10-CM | POA: Diagnosis not present

## 2023-09-18 DIAGNOSIS — N760 Acute vaginitis: Secondary | ICD-10-CM | POA: Diagnosis not present

## 2023-09-18 DIAGNOSIS — O208 Other hemorrhage in early pregnancy: Secondary | ICD-10-CM | POA: Diagnosis not present

## 2023-09-18 DIAGNOSIS — Z3A01 Less than 8 weeks gestation of pregnancy: Secondary | ICD-10-CM | POA: Diagnosis not present

## 2023-09-18 DIAGNOSIS — Z113 Encounter for screening for infections with a predominantly sexual mode of transmission: Secondary | ICD-10-CM | POA: Diagnosis present

## 2023-09-18 DIAGNOSIS — O23591 Infection of other part of genital tract in pregnancy, first trimester: Secondary | ICD-10-CM | POA: Diagnosis not present

## 2023-09-18 LAB — ABO/RH: ABO/RH(D): AB POS

## 2023-09-18 LAB — CBC
HCT: 33.5 % — ABNORMAL LOW (ref 36.0–46.0)
Hemoglobin: 11.2 g/dL — ABNORMAL LOW (ref 12.0–15.0)
MCH: 30.9 pg (ref 26.0–34.0)
MCHC: 33.4 g/dL (ref 30.0–36.0)
MCV: 92.3 fL (ref 80.0–100.0)
Platelets: 319 10*3/uL (ref 150–400)
RBC: 3.63 MIL/uL — ABNORMAL LOW (ref 3.87–5.11)
RDW: 13.4 % (ref 11.5–15.5)
WBC: 10.9 10*3/uL — ABNORMAL HIGH (ref 4.0–10.5)
nRBC: 0 % (ref 0.0–0.2)

## 2023-09-18 LAB — WET PREP, GENITAL
Sperm: NONE SEEN
Trich, Wet Prep: NONE SEEN
WBC, Wet Prep HPF POC: <10 (ref <10)
Yeast Wet Prep HPF POC: NONE SEEN

## 2023-09-18 LAB — POCT URINALYSIS DIP (MANUAL ENTRY)
Bilirubin, UA: NEGATIVE
Blood, UA: NEGATIVE
Glucose, UA: NEGATIVE mg/dL
Leukocytes, UA: NEGATIVE
Nitrite, UA: NEGATIVE
Protein Ur, POC: NEGATIVE mg/dL
Spec Grav, UA: 1.02 (ref 1.010–1.025)
Urobilinogen, UA: 1 U/dL
pH, UA: 7 (ref 5.0–8.0)

## 2023-09-18 LAB — HCG, QUANTITATIVE, PREGNANCY: hCG, Beta Chain, Quant, S: 5245 m[IU]/mL — ABNORMAL HIGH (ref <5)

## 2023-09-18 LAB — POCT URINE PREGNANCY: Preg Test, Ur: POSITIVE — AB

## 2023-09-18 MED ORDER — SCOPOLAMINE 1 MG/3DAYS TD PT72SCOPOLAMINE 1 MG/3DAYS
1.0000 | MEDICATED_PATCH | TRANSDERMAL | 12 refills | Status: DC
Start: 2023-09-18 — End: 2023-12-08
  Filled 2023-09-18: qty 10, 30d supply, fill #0

## 2023-09-18 MED ORDER — PRENATAL PLUS 27-1 MG PO TABS
1.0000 | ORAL_TABLET | Freq: Every day | ORAL | 3 refills | Status: DC
  Filled 2023-09-18: qty 60, fill #0
  Filled 2023-09-22: qty 30, 30d supply, fill #0

## 2023-09-18 MED ORDER — ONDANSETRON HCL 4 MG PO TABS
8.0000 mg | ORAL_TABLET | Freq: Two times a day (BID) | ORAL | 0 refills | Status: DC
  Filled 2023-09-18: qty 20, 5d supply, fill #0

## 2023-09-18 MED ORDER — METRONIDAZOLE 500 MG PO TABS
500.0000 mg | ORAL_TABLET | Freq: Two times a day (BID) | ORAL | 0 refills | Status: DC
  Filled 2023-09-18: qty 14, 7d supply, fill #0

## 2023-09-18 NOTE — ED Provider Notes (Signed)
 [facility]    CSN: [id number] Arrival date & time: 09/18/23  1629      History   Chief Complaint Chief Complaint  Patient presents with   Appointment   Vaginal Bleeding    HPI Cassandra Galloway is a 33 y.o. female.   Patient presents today for evaluation of several days of very light vaginal spotting.  She reports that starting several months ago she and her boyfriend started actively trying to get pregnant.  She last had a normal menstrual cycle 07/05/2023.  She had a light period on 08/11/2023 but felt that this was more than what she read as implantation bleeding.  She has not had any additional bleeding since that time until 2 days ago.  She was on birth control but last used this in 2019.  She has been having breast tenderness as well as nausea.  She denies any abdominal pain, pelvic pain, fever.  She denies any significant past medical history and does not take any daily medication.  She has never been pregnant before.    Past Medical History:  Diagnosis Date   Anemia     Patient Active Problem List   Diagnosis Date Noted   Left anterior cruciate ligament tear    Acute lateral meniscus tear of left knee    Acute medial meniscus tear of left knee     Past Surgical History:  Procedure Laterality Date   ANTERIOR CRUCIATE LIGAMENT REPAIR Left 02/11/2022   Procedure: LEFT KNEE ACL RECONSTRUCTION WITH HAMSTRING AUTOGRAFT, MEDIAL MENISCAL REPAIR;  Surgeon: Jasmine Mesi, MD;  Location: [facility] OR;  Service: Orthopedics;  Laterality: Left;   BONE MARROW BIOPSY  02/11/2022   Procedure: BONE MARROW ASPIRATE;  Surgeon: Jasmine Mesi, MD;  Location: [facility] OR;  Service: Orthopedics;;    OB History   No obstetric history on file.      Home Medications    Prior to Admission medications   Not on File    Family History History reviewed. No pertinent family history.  Social History Social History   Tobacco Use   Smoking status: Some Days    Smokeless tobacco: Never  Vaping Use   Vaping status: Never Used  Substance Use Topics   Alcohol use: Yes    Comment: occasionally   Drug use: Yes    Types: Marijuana    Comment: every other weekend     Allergies   Patient has no known allergies.   Review of Systems Review of Systems  Constitutional:  Negative for activity change, appetite change, fatigue and fever.  Gastrointestinal:  Negative for abdominal pain, diarrhea, nausea and vomiting.  Genitourinary:  Positive for menstrual problem and vaginal bleeding. Negative for dysuria, frequency, pelvic pain, urgency, vaginal discharge and vaginal pain.     Physical Exam Triage Vital Signs ED Triage Vitals  Encounter Vitals Group     BP 09/18/23 1640 117/76     Systolic BP Percentile --      Diastolic BP Percentile --      Pulse Rate 09/18/23 1640 79     Resp 09/18/23 1640 18     Temp 09/18/23 1640 97.6 F (36.4 C)     Temp Source 09/18/23 1640 Oral     SpO2 09/18/23 1640 97 %     Weight --      Height --      Head Circumference --      Peak Flow --      Pain  Score 09/18/23 1638 0     Pain Loc --      Pain Education --      Exclude from Growth Chart --    No data found.  Updated Vital Signs BP 117/76 (BP Location: Left Arm)   Pulse 79   Temp 97.6 F (36.4 C) (Oral)   Resp 18   LMP 07/05/2023 (Exact Date)   SpO2 97%   Visual Acuity Right Eye Distance:   Left Eye Distance:   Bilateral Distance:    Right Eye Near:   Left Eye Near:    Bilateral Near:     Physical Exam Vitals reviewed.  Constitutional:      General: She is awake. She is not in acute distress.    Appearance: Normal appearance. She is well-developed. She is not ill-appearing.     Comments: Very pleasant female appears stated age in no acute distress sitting comfortably in exam room  HENT:     Head: Normocephalic and atraumatic.  Cardiovascular:     Rate and Rhythm: Normal rate and regular rhythm.     Heart sounds: Normal heart  sounds, S1 normal and S2 normal. No murmur heard. Pulmonary:     Effort: Pulmonary effort is normal.     Breath sounds: Normal breath sounds. No wheezing, rhonchi or rales.     Comments: Clear to auscultation bilaterally Abdominal:     Palpations: Abdomen is soft.     Tenderness: There is no abdominal tenderness.  Psychiatric:        Behavior: Behavior is cooperative.      UC Treatments / Results  Labs (all labs ordered are listed, but only abnormal results are displayed) Labs Reviewed  POCT URINALYSIS DIP (MANUAL ENTRY) - Abnormal; Notable for the following components:      Result Value   Ketones, POC UA trace (5) (*)    All other components within normal limits  POCT URINE PREGNANCY - Abnormal; Notable for the following components:   Preg Test, Ur Positive (*)    All other components within normal limits    EKG   Radiology No results found.  Procedures Procedures (including critical care time)  Medications Ordered in UC Medications - No data to display  Initial Impression / Assessment and Plan / UC Course  I have reviewed the triage vital signs and the nursing notes.  Pertinent labs & imaging results that were available during my care of the patient were reviewed by me and considered in my medical decision making (see chart for details).     Patient is well-appearing, afebrile, nontoxic, nontachycardic.  She had a positive pregnancy test in clinic.  Given associated vaginal bleeding I did recommend she go to the MAU for further evaluation and management.  She will go directly to the MAU for further evaluation and management.  She was stable at the time of discharge and safe for private transport.  Final Clinical Impressions(s) / UC Diagnoses   Final diagnoses:  Spotting affecting pregnancy in first trimester  Positive pregnancy test   Discharge Instructions   None    ED Prescriptions   None    PDMP not reviewed this encounter.   Budd Cargo,  PA-C 09/18/23 1711

## 2023-09-18 NOTE — Discharge Instructions (Signed)

## 2023-09-18 NOTE — ED Notes (Signed)
 Patient is being discharged from the Urgent Care and sent to the Emergency Department via POV . Per Erin,PA, patient is in need of higher level of care due to vaginal bleeding during pregnancy. Patient is aware and verbalizes understanding of plan of care.  Vitals:   09/18/23 1640  BP: 117/76  Pulse: 79  Resp: 18  Temp: 97.6 F (36.4 C)  SpO2: 97%

## 2023-09-18 NOTE — MAU Note (Signed)
 Cassandra Galloway is a 33 y.o. at [redacted]w[redacted]d here in MAU reporting: started having some light spotting today. Has had some cramping as well but not like when she has a period.  Had intercourse earlier in the week.  LMP: 07/05/2023 had spotting in April from 4/9-4/13 . Onset of complaint: today Pain score: 2-3 Vitals:   09/18/23 1737  BP: 134/67  Pulse: 89  Resp: 18  Temp: 98.3 F (36.8 C)     FHT: n/a  Lab orders placed from triage:

## 2023-09-18 NOTE — MAU Provider Note (Signed)
 S Cassandra Galloway is a 33 y.o. G1P0 patient who presents to MAU today with complaint of vaginal bleeding and was seen earlier today in Urgent care for vaginal bleeding with a positive pregnancy test. Patient reports her LMP was 07/05/23 and she also reported some spotting 4/9-4/13/25. She denies heavy VB or abdominal cramping and also denies any urinary symptoms, vaginal discharge/burning/itching or irration. She has an upcoming appointment at [facility] on 09/22/23   O BP 134/67   Pulse 89   Temp 98.3 F (36.8 C)   Resp 18   Ht 5\' 2"  (1.575 m)   Wt 108.4 kg   LMP 07/05/2023 (Exact Date)   BMI 43.71 kg/m  Physical Exam Vitals and nursing note reviewed.  Constitutional:      General: She is not in acute distress.    Appearance: Normal appearance. She is obese. She is not ill-appearing.  HENT:     Nose: Nose normal.     Mouth/Throat:     Mouth: Mucous membranes are moist.  Cardiovascular:     Rate and Rhythm: Normal rate.  Pulmonary:     Effort: Pulmonary effort is normal.  Abdominal:     Palpations: Abdomen is soft.  Musculoskeletal:        General: Normal range of motion.     Cervical back: Normal range of motion.  Skin:    General: Skin is warm.  Neurological:     Mental Status: She is alert and oriented to person, place, and time.  Psychiatric:        Mood and Affect: Mood normal.        Behavior: Behavior normal.      MDM  Vaginal bleeding in early pregnancy  CBC: unremarkable  HCG Quant: pending at discharge  ABO:  AB Positive OB Ultrasound: Confirms IUP with single gestational  sac , no YS, no embryo visualized at this time ( Early pregnancy)  - Small Subchorionic hemorrhage visualized  - likely what is causing the vaginal spotting/bleeding Swabs  : Wet Prep with clue cells c/w BV : Rx sent at discharge  UA: pending at discharge    Differential diagnosis considered for 1st trimester vaginal bleeding includes but is not limited to:  ectopic pregnancy, complete spontaneous abortion, incomplete abortion, missed abortion, threatened abortion, embryonic/fetal demise, cervical insufficiency, cervical or vaginal disorder    Orders Placed This Encounter  Procedures   Wet prep, genital    Standing Status:   Standing    Number of Occurrences:   1   US  OB LESS THAN 14 WEEKS WITH OB TRANSVAGINAL    Standing Status:   Standing    Number of Occurrences:   1    Symptom/Reason for Exam:   Vaginal bleeding [[id number]]   CBC    Standing Status:   Standing    Number of Occurrences:   1   hCG, quantitative, pregnancy    Standing Status:   Standing    Number of Occurrences:   1   Urinalysis, Routine w reflex microscopic -Urine, Random    Standing Status:   Standing    Number of Occurrences:   1    Specimen Source:   Urine, Random [244]   ABO/Rh    Standing Status:   Standing    Number of Occurrences:   1      Results for orders placed or performed during the hospital encounter of 09/18/23 (from the past 24 hours)  Wet prep, genital  Status: Abnormal   Collection Time: 09/18/23  5:48 PM  Result Value Ref Range   Yeast Wet Prep HPF POC NONE SEEN NONE SEEN   Trich, Wet Prep NONE SEEN NONE SEEN   Clue Cells Wet Prep HPF POC PRESENT (A) NONE SEEN   WBC, Wet Prep HPF POC <10 <10   Sperm NONE SEEN   CBC     Status: Abnormal   Collection Time: 09/18/23  6:09 PM  Result Value Ref Range   WBC 10.9 (H) 4.0 - 10.5 K/uL   RBC 3.63 (L) 3.87 - 5.11 MIL/uL   Hemoglobin 11.2 (L) 12.0 - 15.0 g/dL   HCT 09.8 (L) 11.9 - 14.7 %   MCV 92.3 80.0 - 100.0 fL   MCH 30.9 26.0 - 34.0 pg   MCHC 33.4 30.0 - 36.0 g/dL   RDW 82.9 56.2 - 13.0 %   Platelets 319 150 - 400 K/uL   nRBC 0.0 0.0 - 0.2 %  ABO/Rh     Status: None   Collection Time: 09/18/23  6:09 PM  Result Value Ref Range   ABO/RH(D) AB POS    No rh immune globuloin      NOT A RH IMMUNE GLOBULIN CANDIDATE, PT RH POSITIVE Performed at [facility], [address]  [place], [place] [address]      Narrative & Impression  CLINICAL DATA:  Vaginal bleeding   EXAM: OBSTETRIC <14 WK US  AND TRANSVAGINAL OB US    TECHNIQUE: Both transabdominal and transvaginal ultrasound examinations were performed for complete evaluation of the gestation as well as the maternal uterus, adnexal regions, and pelvic cul-de-sac. Transvaginal technique was performed to assess early pregnancy.   COMPARISON:  None Available.   FINDINGS: Intrauterine gestational sac: Single   Yolk sac:  Not Visualized.   Embryo:  Not Visualized.   MSD: 6.7 mm   5 w   3 d   Subchorionic hemorrhage: Small subchorionic hemorrhage measures 8 x 2 mm.   Maternal uterus/adnexae: Retroverted uterus. Normal ovaries. Right ovarian corpus luteum cyst.   IMPRESSION: Probable early intrauterine gestational sac, but no yolk sac, fetal pole, or cardiac activity yet visualized. Recommend follow-up quantitative B-HCG levels and follow-up US  in 14 days to assess viability. This recommendation follows SRU consensus guidelines: Diagnostic Criteria for Nonviable Pregnancy Early in the First Trimester. Mel Spine Med 2013; 469:6295-28.     Electronically Signed   By: Limin  Xu M.D.   On: 09/18/2023 19:09      I have reviewed the patient chart and performed the physical exam . I have ordered & interpreted the lab results and reviewed and interpreted the ultrasound images and agree with the radiologist findings  Medications ordered as stated below.  A/P as described below.  Counseling and education provided and patient agreeable  with plan as described below. Verbalized understanding.    ASSESSMENT Medical screening exam complete  1. Bacterial vaginosis  2. Empty gestational sac with ongoing pregnancy  3. [redacted] weeks gestation of pregnancy (Primary)  4. Subchorionic hemorrhage in first trimester     PLAN  Discharge from MAU in stable condition  See AVS for full description of educational  information and instructions provided to the patient at time of discharge  List of options for follow-up given to establish Prenatal care  Warning signs for worsening condition that would warrant emergency follow-up discussed Patient may return to MAU as needed   Allergies as of 09/18/2023   No Known Allergies  Medication List     TAKE these medications    metroNIDAZOLE  500 MG tablet Commonly known as: FLAGYL  Take 1 tablet (500 mg total) by mouth 2 (two) times daily.   ondansetron  4 MG tablet Commonly known as: Zofran  Take 2 tablets (8 mg total) by mouth 2 (two) times daily.   Prenatal Complete 14-0.4 MG Tabs Take 1 tablet by mouth daily.   scopolamine 1 MG/3DAYS Commonly known as: TRANSDERM-SCOP Place 1 patch (1.5 mg total) onto the skin every 3 (three) days.         Cherlynn Cornfield, NP 09/18/2023 7:23 PM

## 2023-09-18 NOTE — ED Triage Notes (Signed)
 Patient presenting with vaginal bleeding onset today. States for the last 2 weeks having breast soreness, urinary frequency, and slight abdominal cramping. States started trying for a child in March, did not have a period in April, then bleeding today. Has not had any testing done. Having nausea and diarrhea occasionally.  Prescriptions or OTC medications tried: No .

## 2023-09-19 ENCOUNTER — Other Ambulatory Visit (HOSPITAL_COMMUNITY): Payer: Self-pay

## 2023-09-21 ENCOUNTER — Other Ambulatory Visit (HOSPITAL_COMMUNITY): Payer: Self-pay

## 2023-09-21 LAB — GC/CHLAMYDIA PROBE AMP (~~LOC~~) NOT AT ARMC
Chlamydia: NEGATIVE
Comment: NEGATIVE
Comment: NORMAL
Neisseria Gonorrhea: NEGATIVE

## 2023-09-22 ENCOUNTER — Other Ambulatory Visit (HOSPITAL_COMMUNITY): Payer: Self-pay

## 2023-09-22 MED ORDER — ALBUTEROL SULFATE HFA 108 (90 BASE) MCG/ACT IN AERS
2.0000 | INHALATION_SPRAY | Freq: Four times a day (QID) | RESPIRATORY_TRACT | 1 refills | Status: AC | PRN
  Filled 2023-09-22: qty 6.7, 25d supply, fill #0

## 2023-09-23 ENCOUNTER — Other Ambulatory Visit: Payer: Self-pay

## 2023-10-01 ENCOUNTER — Other Ambulatory Visit (HOSPITAL_COMMUNITY): Payer: Self-pay

## 2023-10-13 ENCOUNTER — Other Ambulatory Visit (HOSPITAL_COMMUNITY): Payer: Self-pay

## 2023-10-13 MED ORDER — PROMETHAZINE HCL 25 MG PO TABS
25.0000 mg | ORAL_TABLET | ORAL | 1 refills | Status: DC
  Filled 2023-10-13: qty 120, 20d supply, fill #0

## 2023-10-20 ENCOUNTER — Other Ambulatory Visit (HOSPITAL_COMMUNITY): Payer: Self-pay

## 2023-10-20 MED ORDER — TERCONAZOLE 0.4 % VA CREA
1.0000 | TOPICAL_CREAM | Freq: Every day | VAGINAL | 0 refills | Status: DC
  Filled 2023-10-20 – 2023-11-19 (×2): qty 45, 7d supply, fill #0

## 2023-10-20 MED ORDER — NITROFURANTOIN MONOHYD MACRO 100 MG PO CAPS
100.0000 mg | ORAL_CAPSULE | Freq: Two times a day (BID) | ORAL | 0 refills | Status: DC
  Filled 2023-10-20 – 2023-11-19 (×2): qty 14, 7d supply, fill #0

## 2023-10-23 ENCOUNTER — Other Ambulatory Visit (HOSPITAL_COMMUNITY): Payer: Self-pay

## 2023-10-30 ENCOUNTER — Other Ambulatory Visit (HOSPITAL_COMMUNITY): Payer: Self-pay

## 2023-11-13 ENCOUNTER — Other Ambulatory Visit (HOSPITAL_COMMUNITY): Payer: Self-pay

## 2023-11-13 MED ORDER — METOCLOPRAMIDE HCL 10 MG PO TABS
10.0000 mg | ORAL_TABLET | Freq: Three times a day (TID) | ORAL | 0 refills | Status: DC
  Filled 2023-11-13: qty 90, 30d supply, fill #0

## 2023-11-13 MED ORDER — PRENATAL 27-1 MG PO TABS
1.0000 | ORAL_TABLET | Freq: Every day | ORAL | 2 refills | Status: DC
  Filled 2023-11-13: qty 30, 30d supply, fill #0

## 2023-11-19 ENCOUNTER — Other Ambulatory Visit (HOSPITAL_COMMUNITY): Payer: Self-pay

## 2023-12-02 ENCOUNTER — Inpatient Hospital Stay (HOSPITAL_COMMUNITY)

## 2023-12-02 ENCOUNTER — Inpatient Hospital Stay (HOSPITAL_COMMUNITY)
Admission: AD | Admit: 2023-12-02 | Discharge: 2023-12-02 | Disposition: A | Discharge status: Home / Self Care | Attending: Obstetrics & Gynecology | Admitting: Obstetrics & Gynecology

## 2023-12-02 ENCOUNTER — Encounter (HOSPITAL_COMMUNITY): Payer: Self-pay | Admitting: Obstetrics and Gynecology

## 2023-12-02 ENCOUNTER — Other Ambulatory Visit: Payer: Self-pay

## 2023-12-02 DIAGNOSIS — O42919 Preterm premature rupture of membranes, unspecified as to length of time between rupture and onset of labor, unspecified trimester: Secondary | ICD-10-CM | POA: Diagnosis not present

## 2023-12-02 DIAGNOSIS — Z3A15 15 weeks gestation of pregnancy: Secondary | ICD-10-CM

## 2023-12-02 DIAGNOSIS — O42912 Preterm premature rupture of membranes, unspecified as to length of time between rupture and onset of labor, second trimester: Secondary | ICD-10-CM | POA: Diagnosis not present

## 2023-12-02 DIAGNOSIS — Z0371 Encounter for suspected problem with amniotic cavity and membrane ruled out: Secondary | ICD-10-CM | POA: Diagnosis present

## 2023-12-02 DIAGNOSIS — O4392 Unspecified placental disorder, second trimester: Secondary | ICD-10-CM

## 2023-12-02 LAB — POCT FERN TEST: POCT Fern Test: POSITIVE

## 2023-12-02 NOTE — MAU Note (Signed)
 Cassandra Galloway is a 33 y.o. at [redacted]w[redacted]d here in MAU reporting: she heard a big pop @ 1545 while urinating and when wiping notices a large amount of mucous with very small amount of blood.  States has now been leaking a clear liquid, it's like I'm peeing.  Denies VB.  Reports hadn't begun to feel FM yet.  LMP: 07/05/2023 Onset of complaint: today @ 1545 Pain score: 0 Vitals:   12/02/23 1759  BP: 131/67  Pulse: 82  Resp: 19  Temp: 98.5 F (36.9 C)  SpO2: 100%     FHT: Deferred secondary maternal apparel, wearing dress.  Will doppler once in room  Lab orders placed from triage: None

## 2023-12-02 NOTE — MAU Note (Signed)
 RN offered patient spiritual care/chaplain consult, patient refused.

## 2023-12-02 NOTE — MAU Provider Note (Signed)
 Chief Complaint:  Rupture of Membranes   HPI   None     Cassandra Galloway is a 33 y.o. G1P0 at [redacted]w[redacted]d who presents to maternity admissions reporting leaking fluid. She reports that at 1545 today, she heard and felt a pop while urinating. When wiping, she noted a large amount of mucous with a small amount of blood on the toilet paper. Since that time, she has been leaking clear liquid, states it's like I'm peeing. She has gushes when sneezing or coughing, otherwise a constant trickle. Denies ongoing vaginal bleeding. She does not endorse contractions. At [redacted]w[redacted]d, patient is preterm and previability.   Pregnancy Course: Receives care at [facility]. Prenatal records reviewed. Gestational age updated.  Past Medical History:  Diagnosis Date   Anemia    OB History  Gravida Para Term Preterm AB Living  1       SAB IAB Ectopic Multiple Live Births          # Outcome Date GA Lbr Len/2nd Weight Sex Type Anes PTL Lv  1 Current            Past Surgical History:  Procedure Laterality Date   ANTERIOR CRUCIATE LIGAMENT REPAIR Left 02/11/2022   Procedure: LEFT KNEE ACL RECONSTRUCTION WITH HAMSTRING AUTOGRAFT, MEDIAL MENISCAL REPAIR;  Surgeon: Addie Cordella Hamilton, MD;  Location: [facility] OR;  Service: Orthopedics;  Laterality: Left;   BONE MARROW BIOPSY  02/11/2022   Procedure: BONE MARROW ASPIRATE;  Surgeon: Addie Cordella Hamilton, MD;  Location: [facility] OR;  Service: Orthopedics;;   History reviewed. No pertinent family history. Social History   Tobacco Use   Smoking status: Some Days   Smokeless tobacco: Never  Vaping Use   Vaping status: Never Used  Substance Use Topics   Alcohol use: Yes    Comment: occasionally   Drug use: Not Currently    Types: Marijuana    Comment: every other weekend   No Known Allergies Medications Prior to Admission  Medication Sig Dispense Refill Last Dose/Taking   metoCLOPramide  (REGLAN ) 10 MG tablet Take 1 tablet (10 mg total) by mouth 3 (three)  times daily. 90 tablet 0 Past Week   prenatal vitamin w/FE, FA (PRENATAL 1 + 1) 27-1 MG TABS tablet Take 1 tablet by mouth daily. 60 tablet 3 12/01/2023 Morning   albuterol  (PROVENTIL  HFA) 108 (90 Base) MCG/ACT inhaler Inhale 2 puffs into the lungs every 6 (six) hours as needed. 6.7 g 1    metroNIDAZOLE  (FLAGYL ) 500 MG tablet Take 1 tablet (500 mg total) by mouth 2 (two) times daily. 14 tablet 0    ondansetron  (ZOFRAN ) 4 MG tablet Take 2 tablets (8 mg total) by mouth 2 (two) times daily. 20 tablet 0    Prenatal 27-1 MG TABS Take 1 tablet by mouth daily. 90 tablet 2    promethazine  (PHENERGAN ) 25 MG tablet Take 1 tablet (25 mg total) by mouth every 4 (four) hours. 120 tablet 1    scopolamine  (TRANSDERM-SCOP) 1 MG/3DAYS Place 1 patch (1.5 mg total) onto the skin every 3 (three) days. 10 patch 12    terconazole  (TERAZOL 7 ) 0.4 % vaginal cream Place 1 applicatorful vaginally at bedtime for 7 days 45 g 0     I have reviewed patient's Past Medical Hx, Surgical Hx, Family Hx, Social Hx, medications and allergies.   ROS  Pertinent items noted in HPI and remainder of comprehensive ROS otherwise negative.   PHYSICAL EXAM  Patient Vitals for the past 24 hrs:  BP Temp Temp src Pulse Resp SpO2 Height Weight  12/02/23 1817 122/64 -- -- 82 -- -- -- --  12/02/23 1759 131/67 98.5 F (36.9 C) Oral 82 19 100 % -- --  12/02/23 1753 -- -- -- -- -- -- 5' 2 (1.575 m) 106.3 kg    Constitutional: Well-developed, well-nourished female in no acute distress.  HEENT: atraumatic, normocephalic. Neck has normal ROM. EOM intact. Cardiovascular: normal rate & rhythm, warm and well-perfused Respiratory: normal effort, no problems with respiration noted GI: Abd soft, non-tender, non-distended MSK: Extremities nontender, no edema, normal ROM Skin: warm and dry. Acyanotic, no jaundice or pallor. Neurologic: Alert and oriented x 4. No abnormal coordination. Psychiatric: Speech not slurred, not rapid/pressured. Patient is  cooperative. GU: no CVA tenderness  Labs: Results for orders placed or performed during the hospital encounter of 12/02/23 (from the past 24 hours)  POCT fern test     Status: None   Collection Time: 12/02/23  6:26 PM  Result Value Ref Range   POCT Fern Test Positive = ruptured amniotic membanes     Imaging:  No results found.  MDM & MAU COURSE  MDM: High  MAU Course: -Vital signs within normal limits. -Positive fern, positive for PPROM. Discussed that positive fern means that water is broken, patient tearful upon hearing this news.  -Discussed with Dr. Eveline, recommends US  to evaluate AFI. -Patient tearful and upset, her best friend is with her at bedside. Due to significant emotional distress, will have US  completed bedside. Discussed that US  will be needed to determine next steps in management due to early gestational age. -US  shows virtually no amniotic fluid. Fetal heart rate 173 bpm. -Discussed with Dr. Diedre, will see patient at bedside to discuss management options. -Patient offered chaplain services, declines.  Orders Placed This Encounter  Procedures   US  MFM OB LIMITED   POCT fern test   No orders of the defined types were placed in this encounter.   ASSESSMENT   1. Preterm premature rupture of membranes (PPROM) delivered, current hospitalization   2. [redacted] weeks gestation of pregnancy     PLAN  Dr. Diedre discussed options of induction versus D&E with patient, patient would like to go home and follow up with an office visit tomorrow before making a decision. Office visit scheduled for tomorrow morning.    Allergies as of 12/02/2023   (No Known Allergies)    Cassandra DELENA Sear, PA

## 2023-12-02 NOTE — Discharge Instructions (Addendum)
 I am so so sorry this is happening. This is not your fault, there is nothing that you did or did not do that caused this to happen.  Premature Prelabor Rupture of Membranes (PPROM): Premature rupture of membranes (PROM) is a rupture (breaking open) of the membranes (amniotic sac) before labor begins. If PROM occurs before 37 weeks of pregnancy, it is called preterm premature rupture of membranes (PPROM).  What Does Fetal Viability Mean? Fetal viability refers to the stage in pregnancy when the fetus is developed enough to survive outside the womb, typically with the help of a neonatal intensive care unit (NICU). This is not a fixed week but depends on multiple factors including organ maturity, birth weight, and medical support available. Viability by Gestational Week Before 22 weeks: Survival is extremely rare. 22-23 weeks: Considered the borderline of viability; survival rates range from 10-35% with advanced NICU care. 24 weeks: Survival increases to about 50-60%. 25-26 weeks: Around 75-80% of babies survive. 27+ weeks: Viability is high, with more than 90% survival by 28 weeks.  Management Options Before 22 Weeks Surgical evacuation: This procedure, called a dilation and evacuation, can be performed in the second trimester, typically up to about 24 weeks. Surgical evacuation is the most common treatment women choose and involves removing the pregnancy through the cervix in the operating room while you are asleep. The cervix needs to be opened about 1-2 inches in diameter. The doctors can use different ways to open the cervix based on how far along the pregnancy is and your individual circumstance. The goal is to provide the safest care for each patient. After a surgical evacuation, normal activity can typically be resumed the following day.  Labor induction: This treatment uses medicines to cause the uterus to go into labor. For women with pregnancies beyond 24 weeks, this is commonly the only  option. If you choose this option, you will be admitted to the Labor and Delivery Unit  and will have medicine to cause labor. It may take 1-2 days for the uterus to go into labor and for the delivery to be complete. Up to 5% of women in the second trimester do not go into labor and need a surgical evacuation.

## 2023-12-02 NOTE — Consult Note (Signed)
 Pt seen at bedside.  Reviewed evaluation is positive for previable PPROM at [redacted]w[redacted]d, with positive fern and near anhydramnios on US  (final report pending). + FHR. She is understandably upset. Had frank conversation about prognosis.  Discussed management options, with recommendation for induction of labor vs. D&E given remote from viability and risk of intraamniotic infection with maternal morbidity.  Patient is inconsolable at this time, supported in room by her best friend. She is not in a position to make a decision about next steps. Offered she could go home and be seen in office tomorrow vs. stay for observation in [facility] overnight and make decision tomorrow. At this time she is leaning toward going home and being seen in office tomorrow. Will send a message to [place] [facility] office for follow up.   MAU provider Wallace updated on conversation.   EMERSON Chapel MD 12/02/23 9:03 PM

## 2023-12-04 ENCOUNTER — Other Ambulatory Visit (HOSPITAL_COMMUNITY): Payer: Self-pay

## 2023-12-04 MED ORDER — SERTRALINE HCL 50 MG PO TABS
50.0000 mg | ORAL_TABLET | Freq: Every evening | ORAL | 1 refills | Status: DC
  Filled 2023-12-04: qty 30, 30d supply, fill #0

## 2023-12-06 ENCOUNTER — Inpatient Hospital Stay (HOSPITAL_COMMUNITY): Admitting: Anesthesiology

## 2023-12-06 ENCOUNTER — Encounter (HOSPITAL_COMMUNITY): Payer: Self-pay | Admitting: Obstetrics and Gynecology

## 2023-12-06 ENCOUNTER — Encounter (HOSPITAL_COMMUNITY)
Admission: AD | Disposition: A | Discharge status: Home / Self Care | Payer: Self-pay | Source: Home / Self Care | Attending: Obstetrics and Gynecology

## 2023-12-06 ENCOUNTER — Other Ambulatory Visit: Payer: Self-pay

## 2023-12-06 ENCOUNTER — Inpatient Hospital Stay (HOSPITAL_COMMUNITY)
Admission: AD | Admit: 2023-12-06 | Discharge: 2023-12-08 | DRG: 817 | Disposition: A | Discharge status: Home / Self Care | Attending: Obstetrics and Gynecology | Admitting: Obstetrics and Gynecology

## 2023-12-06 ENCOUNTER — Inpatient Hospital Stay (HOSPITAL_COMMUNITY)

## 2023-12-06 DIAGNOSIS — Z3A00 Weeks of gestation of pregnancy not specified: Secondary | ICD-10-CM | POA: Diagnosis not present

## 2023-12-06 DIAGNOSIS — O42912 Preterm premature rupture of membranes, unspecified as to length of time between rupture and onset of labor, second trimester: Principal | ICD-10-CM | POA: Diagnosis present

## 2023-12-06 DIAGNOSIS — Z3A16 16 weeks gestation of pregnancy: Secondary | ICD-10-CM

## 2023-12-06 DIAGNOSIS — O99334 Smoking (tobacco) complicating childbirth: Secondary | ICD-10-CM | POA: Diagnosis present

## 2023-12-06 DIAGNOSIS — Z6841 Body Mass Index (BMI) 40.0 and over, adult: Secondary | ICD-10-CM

## 2023-12-06 DIAGNOSIS — O9081 Anemia of the puerperium: Secondary | ICD-10-CM | POA: Diagnosis not present

## 2023-12-06 DIAGNOSIS — I959 Hypotension, unspecified: Secondary | ICD-10-CM | POA: Diagnosis present

## 2023-12-06 DIAGNOSIS — E669 Obesity, unspecified: Secondary | ICD-10-CM

## 2023-12-06 DIAGNOSIS — O42919 Preterm premature rupture of membranes, unspecified as to length of time between rupture and onset of labor, unspecified trimester: Principal | ICD-10-CM | POA: Diagnosis present

## 2023-12-06 DIAGNOSIS — O411220 Chorioamnionitis, second trimester, not applicable or unspecified: Secondary | ICD-10-CM | POA: Diagnosis present

## 2023-12-06 DIAGNOSIS — D62 Acute posthemorrhagic anemia: Secondary | ICD-10-CM | POA: Diagnosis not present

## 2023-12-06 HISTORY — PX: DILATION AND CURETTAGE OF UTERUS: SHX78

## 2023-12-06 LAB — CBC
HCT: 30.5 % — ABNORMAL LOW (ref 36.0–46.0)
Hemoglobin: 10.1 g/dL — ABNORMAL LOW (ref 12.0–15.0)
MCH: 31.2 pg (ref 26.0–34.0)
MCHC: 33.1 g/dL (ref 30.0–36.0)
MCV: 94.1 fL (ref 80.0–100.0)
Platelets: 259 K/uL (ref 150–400)
RBC: 3.24 MIL/uL — ABNORMAL LOW (ref 3.87–5.11)
RDW: 12.9 % (ref 11.5–15.5)
WBC: 20 K/uL — ABNORMAL HIGH (ref 4.0–10.5)
nRBC: 0 % (ref 0.0–0.2)

## 2023-12-06 LAB — COMPREHENSIVE METABOLIC PANEL WITH GFR
ALT: 15 U/L (ref 0–44)
AST: 17 U/L (ref 15–41)
Albumin: 2.9 g/dL — ABNORMAL LOW (ref 3.5–5.0)
Alkaline Phosphatase: 52 U/L (ref 38–126)
Anion gap: 8 (ref 5–15)
BUN: 5 mg/dL — ABNORMAL LOW (ref 6–20)
CO2: 21 mmol/L — ABNORMAL LOW (ref 22–32)
Calcium: 8.8 mg/dL — ABNORMAL LOW (ref 8.9–10.3)
Chloride: 105 mmol/L (ref 98–111)
Creatinine, Ser: 0.58 mg/dL (ref 0.44–1.00)
GFR, Estimated: >60 mL/min (ref >60)
Glucose, Bld: 102 mg/dL — ABNORMAL HIGH (ref 70–99)
Potassium: 3.7 mmol/L (ref 3.5–5.1)
Sodium: 134 mmol/L — ABNORMAL LOW (ref 135–145)
Total Bilirubin: 0.4 mg/dL (ref 0.0–1.2)
Total Protein: 6.7 g/dL (ref 6.5–8.1)

## 2023-12-06 LAB — POCT I-STAT EG7
Acid-base deficit: 6 mmol/L — ABNORMAL HIGH (ref 0.0–2.0)
Bicarbonate: 19.1 mmol/L — ABNORMAL LOW (ref 20.0–28.0)
Calcium, Ion: 1.17 mmol/L (ref 1.15–1.40)
HCT: 19 % — ABNORMAL LOW (ref 36.0–46.0)
Hemoglobin: 6.5 g/dL — CL (ref 12.0–15.0)
O2 Saturation: 90 %
Patient temperature: 36.9
Potassium: 3.6 mmol/L (ref 3.5–5.1)
Sodium: 134 mmol/L — ABNORMAL LOW (ref 135–145)
TCO2: 20 mmol/L — ABNORMAL LOW (ref 22–32)
pCO2, Ven: 36.9 mmHg — ABNORMAL LOW (ref 44–60)
pH, Ven: 7.323 (ref 7.25–7.43)
pO2, Ven: 62 mmHg — ABNORMAL HIGH (ref 32–45)

## 2023-12-06 LAB — PREPARE RBC (CROSSMATCH)

## 2023-12-06 SURGERY — DILATION AND EVACUATION, UTERUS
Anesthesia: Choice | Laterality: Bilateral

## 2023-12-06 SURGERY — DILATION AND CURETTAGE
Anesthesia: General

## 2023-12-06 MED ORDER — FENTANYL CITRATE (PF) 100 MCG/2ML IJ SOLN
INTRAMUSCULAR | Status: AC
Start: 2023-12-06 — End: 2023-12-06
  Filled 2023-12-06: qty 2

## 2023-12-06 MED ORDER — PANTOPRAZOLE SODIUM 40 MG PO TBEC
40.0000 mg | DELAYED_RELEASE_TABLET | Freq: Every day | ORAL | Status: DC
  Administered 2023-12-07 – 2023-12-08 (×2): 40 mg via ORAL
  Filled 2023-12-06 (×2): qty 1

## 2023-12-06 MED ORDER — PROPOFOL 10 MG/ML IV BOLUS
INTRAVENOUS | Status: DC | PRN
  Administered 2023-12-06: 200 mg via INTRAVENOUS

## 2023-12-06 MED ORDER — DOCUSATE SODIUM 100 MG PO CAPS: 100.0000 mg | ORAL_CAPSULE | Freq: Every day | ORAL | Status: DC

## 2023-12-06 MED ORDER — MIDAZOLAM HCL 2 MG/2ML IJ SOLN
INTRAMUSCULAR | Status: AC
Start: 2023-12-06 — End: 2023-12-06
  Filled 2023-12-06: qty 2

## 2023-12-06 MED ORDER — KETOROLAC TROMETHAMINE 30 MG/ML IJ SOLN: 30.0000 mg | Freq: Once | INTRAMUSCULAR | Status: DC

## 2023-12-06 MED ORDER — OXYCODONE HCL 5 MG PO TABS: 5.0000 mg | ORAL_TABLET | ORAL | Status: DC | PRN

## 2023-12-06 MED ORDER — IBUPROFEN 600 MG PO TABS
600.0000 mg | ORAL_TABLET | Freq: Four times a day (QID) | ORAL | Status: DC
  Administered 2023-12-07 – 2023-12-08 (×6): 600 mg via ORAL
  Filled 2023-12-06 (×7): qty 1

## 2023-12-06 MED ORDER — LIDOCAINE HCL 1 % IJ SOLN
INTRAMUSCULAR | Status: DC | PRN
  Administered 2023-12-06: 20 mL

## 2023-12-06 MED ORDER — HYDROMORPHONE HCL 1 MG/ML IJ SOLN
0.2500 mg | INTRAMUSCULAR | Status: DC | PRN
  Administered 2023-12-06 – 2023-12-07 (×4): 0.5 mg via INTRAVENOUS

## 2023-12-06 MED ORDER — LACTATED RINGERS IV BOLUS
1000.0000 mL | Freq: Once | INTRAVENOUS | Status: AC
  Administered 2023-12-06: 1000 mL via INTRAVENOUS

## 2023-12-06 MED ORDER — MENTHOL 3 MG MT LOZG
1.0000 | LOZENGE | OROMUCOSAL | Status: DC | PRN
  Administered 2023-12-07: 3 mg via ORAL
  Filled 2023-12-06: qty 9

## 2023-12-06 MED ORDER — LACTATED RINGERS IV SOLN: INTRAVENOUS | Status: AC

## 2023-12-06 MED ORDER — FENTANYL CITRATE (PF) 100 MCG/2ML IJ SOLN
INTRAMUSCULAR | Status: DC | PRN
  Administered 2023-12-06 (×4): 50 ug via INTRAVENOUS

## 2023-12-06 MED ORDER — OXYCODONE HCL 5 MG PO TABS: 5.0000 mg | ORAL_TABLET | Freq: Once | ORAL | Status: DC | PRN

## 2023-12-06 MED ORDER — DEXAMETHASONE SODIUM PHOSPHATE 10 MG/ML IJ SOLN
INTRAMUSCULAR | Status: DC | PRN
  Administered 2023-12-06: 10 mg via INTRAVENOUS

## 2023-12-06 MED ORDER — FENTANYL CITRATE (PF) 100 MCG/2ML IJ SOLN: 100.0000 ug | Freq: Once | INTRAMUSCULAR | Status: DC

## 2023-12-06 MED ORDER — ACETAMINOPHEN 500 MG PO TABS
1000.0000 mg | ORAL_TABLET | Freq: Four times a day (QID) | ORAL | Status: DC
  Administered 2023-12-07 – 2023-12-08 (×6): 1000 mg via ORAL
  Filled 2023-12-06 (×7): qty 2

## 2023-12-06 MED ORDER — ONDANSETRON HCL 4 MG/2ML IJ SOLN: 4.0000 mg | Freq: Four times a day (QID) | INTRAMUSCULAR | Status: DC | PRN

## 2023-12-06 MED ORDER — HYDROMORPHONE HCL 1 MG/ML IJ SOLN
INTRAMUSCULAR | Status: AC
  Filled 2023-12-06: qty 0.5

## 2023-12-06 MED ORDER — LACTATED RINGERS IV SOLN: INTRAVENOUS | Status: DC

## 2023-12-06 MED ORDER — ONDANSETRON HCL 4 MG/2ML IJ SOLN: 4.0000 mg | Freq: Once | INTRAMUSCULAR | Status: DC | PRN

## 2023-12-06 MED ORDER — ONDANSETRON HCL 4 MG PO TABS: 4.0000 mg | ORAL_TABLET | Freq: Four times a day (QID) | ORAL | Status: DC | PRN

## 2023-12-06 MED ORDER — CALCIUM CARBONATE ANTACID 500 MG PO CHEW: 2.0000 | CHEWABLE_TABLET | ORAL | Status: DC | PRN

## 2023-12-06 MED ORDER — LIDOCAINE HCL (CARDIAC) PF 100 MG/5ML IV SOSY
PREFILLED_SYRINGE | INTRAVENOUS | Status: DC | PRN
  Administered 2023-12-06: 100 mg via INTRAVENOUS

## 2023-12-06 MED ORDER — ACETAMINOPHEN 325 MG PO TABS
650.0000 mg | ORAL_TABLET | ORAL | Status: DC | PRN
Start: 2023-12-06 — End: 2023-12-06
  Administered 2023-12-06: 650 mg via ORAL
  Filled 2023-12-06: qty 2

## 2023-12-06 MED ORDER — LIDOCAINE HCL 1 % IJ SOLN
INTRAMUSCULAR | Status: AC
  Filled 2023-12-06: qty 20

## 2023-12-06 MED ORDER — ONDANSETRON HCL 4 MG/2ML IJ SOLN
INTRAMUSCULAR | Status: AC
Start: 2023-12-06 — End: 2023-12-06
  Filled 2023-12-06: qty 2

## 2023-12-06 MED ORDER — MISOPROSTOL 200 MCG PO TABS: 800.0000 ug | ORAL_TABLET | Freq: Once | ORAL | Status: AC

## 2023-12-06 MED ORDER — KETOROLAC TROMETHAMINE 30 MG/ML IJ SOLN
INTRAMUSCULAR | Status: AC
  Filled 2023-12-06: qty 1

## 2023-12-06 MED ORDER — MISOPROSTOL 200 MCG PO TABS
ORAL_TABLET | ORAL | Status: AC
  Administered 2023-12-06: 800 ug via RECTAL
  Filled 2023-12-06: qty 4

## 2023-12-06 MED ORDER — FENTANYL CITRATE (PF) 100 MCG/2ML IJ SOLN
INTRAMUSCULAR | Status: AC
  Filled 2023-12-06: qty 2

## 2023-12-06 MED ORDER — SERTRALINE HCL 50 MG PO TABS
50.0000 mg | ORAL_TABLET | Freq: Every day | ORAL | Status: DC
  Administered 2023-12-07 – 2023-12-08 (×2): 50 mg via ORAL
  Filled 2023-12-06 (×2): qty 1

## 2023-12-06 MED ORDER — KETOROLAC TROMETHAMINE 30 MG/ML IJ SOLN
INTRAMUSCULAR | Status: DC | PRN
  Administered 2023-12-06: 30 mg via INTRAVENOUS

## 2023-12-06 MED ORDER — TRANEXAMIC ACID-NACL 1000-0.7 MG/100ML-% IV SOLN
INTRAVENOUS | Status: AC
  Filled 2023-12-06: qty 100

## 2023-12-06 MED ORDER — OXYCODONE HCL 5 MG/5ML PO SOLN: 5.0000 mg | Freq: Once | ORAL | Status: DC | PRN

## 2023-12-06 MED ORDER — ALBUMIN HUMAN 5 % IV SOLN: INTRAVENOUS | Status: DC | PRN

## 2023-12-06 MED ORDER — LACTATED RINGERS IV SOLN: 125.0000 mL/h | INTRAVENOUS | Status: DC

## 2023-12-06 MED ORDER — SODIUM CHLORIDE 0.9 % IV SOLN
3.0000 g | Freq: Four times a day (QID) | INTRAVENOUS | Status: DC
  Administered 2023-12-06: 3 g via INTRAVENOUS
  Filled 2023-12-06: qty 8

## 2023-12-06 MED ORDER — ALBUMIN HUMAN 5 % IV SOLN
INTRAVENOUS | Status: AC
  Filled 2023-12-06: qty 500

## 2023-12-06 MED ORDER — ONDANSETRON HCL 4 MG/2ML IJ SOLN
INTRAMUSCULAR | Status: DC | PRN
  Administered 2023-12-06: 4 mg via INTRAVENOUS

## 2023-12-06 MED ORDER — TRANEXAMIC ACID-NACL 1000-0.7 MG/100ML-% IV SOLN
INTRAVENOUS | Status: DC | PRN
  Administered 2023-12-06: 1000 mg via INTRAVENOUS

## 2023-12-06 MED ORDER — SODIUM CHLORIDE 0.9% IV SOLUTION: Freq: Once | INTRAVENOUS | Status: DC

## 2023-12-06 MED ORDER — ALUM & MAG HYDROXIDE-SIMETH 200-200-20 MG/5ML PO SUSP: 30.0000 mL | ORAL | Status: DC | PRN

## 2023-12-06 MED ORDER — MIDAZOLAM HCL 2 MG/2ML IJ SOLN
INTRAMUSCULAR | Status: DC | PRN
  Administered 2023-12-06: 2 mg via INTRAVENOUS

## 2023-12-06 MED ORDER — PHENYLEPHRINE HCL (PRESSORS) 10 MG/ML IV SOLN
INTRAVENOUS | Status: DC | PRN
  Administered 2023-12-06: 80 ug via INTRAVENOUS
  Administered 2023-12-06 (×2): 120 ug via INTRAVENOUS
  Administered 2023-12-06: 80 ug via INTRAVENOUS

## 2023-12-06 MED ORDER — OXYTOCIN-SODIUM CHLORIDE 30-0.9 UT/500ML-% IV SOLN
INTRAVENOUS | Status: AC
  Filled 2023-12-06: qty 500

## 2023-12-06 MED ORDER — OXYTOCIN-SODIUM CHLORIDE 30-0.9 UT/500ML-% IV SOLN
2.5000 [IU]/h | INTRAVENOUS | Status: DC | PRN
  Administered 2023-12-07: 2.5 [IU]/h via INTRAVENOUS
  Filled 2023-12-06: qty 500

## 2023-12-06 MED ORDER — DEXAMETHASONE SODIUM PHOSPHATE 10 MG/ML IJ SOLN
INTRAMUSCULAR | Status: AC
Start: 2023-12-06 — End: 2023-12-06
  Filled 2023-12-06: qty 1

## 2023-12-06 MED ORDER — KETOROLAC TROMETHAMINE 30 MG/ML IJ SOLN: 30.0000 mg | Freq: Once | INTRAMUSCULAR | Status: AC | PRN

## 2023-12-06 MED ORDER — SUCCINYLCHOLINE CHLORIDE 200 MG/10ML IV SOSY
PREFILLED_SYRINGE | INTRAVENOUS | Status: DC | PRN
  Administered 2023-12-06: 100 mg via INTRAVENOUS

## 2023-12-06 SURGICAL SUPPLY — 15 items
CATH ROBINSON RED A/P 16FR (CATHETERS) IMPLANT
CLOTH BEACON ORANGE TIMEOUT ST (SAFETY) IMPLANT
GLOVE BIO SURGEON STRL SZ 6.5 (GLOVE) IMPLANT
GLOVE BIOGEL PI IND STRL 7.0 (GLOVE) IMPLANT
GOWN STRL REUS W/TWL LRG LVL3 (GOWN DISPOSABLE) IMPLANT
KIT BERKELEY 1ST TRIMESTER 3/8 (MISCELLANEOUS) IMPLANT
PACK VAGINAL MINOR WOMEN LF (CUSTOM PROCEDURE TRAY) IMPLANT
PAD PREP 24X48 CUFFED NSTRL (MISCELLANEOUS) IMPLANT
SET BERKELEY SUCTION TUBING (SUCTIONS) IMPLANT
TOWEL OR 17X24 6PK STRL BLUE (TOWEL DISPOSABLE) IMPLANT
VACURETTE 10 RIGID CVD (CANNULA) IMPLANT
VACURETTE 7MM CVD STRL WRAP (CANNULA) IMPLANT
VACURETTE 8 RIGID CVD (CANNULA) IMPLANT
VACURETTE 9 RIGID CVD (CANNULA) IMPLANT
WATER STERILE IRR 1000ML POUR (IV SOLUTION) IMPLANT

## 2023-12-06 NOTE — Transfer of Care (Signed)
 Immediate Anesthesia Transfer of Care Note  Patient: Cassandra Galloway  Procedure(s) Performed: DILATION AND CURETTAGE  Patient Location: PACU  Anesthesia Type:General  Level of Consciousness: awake, alert , and oriented  Airway & Oxygen Therapy: Patient Spontanous Breathing  Post-op Assessment: Report given to RN and Post -op Vital signs reviewed and stable  Post vital signs: Reviewed and stable  Last Vitals:  Vitals Value Taken Time  BP    Temp    Pulse    Resp    SpO2      Last Pain:  Vitals:   12/06/23 2142  TempSrc: Oral  PainSc:          Complications: No notable events documented.

## 2023-12-06 NOTE — Discharge Instructions (Signed)
 Call office with any concerns [phone]

## 2023-12-06 NOTE — Op Note (Signed)
 Operative Note    Preoperative Diagnosis Retained placenta at 16weeks S/P Preterm premature spontaneous delivery    Postoperative Diagnosis: Same 3. Postpartum hemorrhage    Procedure: Suction dilation and curettage of uterus under ultrasound guidance   Surgeon: Delana, C DO   Anesthesia: General and 1% lidocaine  plain  Fluids: LR 1L and albumin  EBL: UOP: Meds: Unasyn  and TXA  Findings: Uterus sounded to 16cm. Adherent placental products. Moderate to large amount of bleeding. Endometrial stripe <0.5cm on ultrasound following completion of procedure    Specimen: Products of conception/Placental tissue   Procedure Note   Procedure Note  Consent was confirmed from  pt. Patient was taken to the operating room where general anesthesia was administered without difficulty. She was then prepped and draped in the normal sterile fashion while in the dorsal lithotomy position. An appropriate time out was performed.  An exam under anesthesia noted an anteverted uterus.  A speculum was then placed within the vagina and the anterior lip of the cervix identified and grasped with a single toothed tenaculum. Uterus was then sounded to 16cm.   A ring forcep was used to tease out the placenta however it was noted to be rather adherent and only small portions of it were evacuated. At this time an order was placed to have ultrasound guidance during procedure - call placed Next, a banjo curette was used. Moderate portions of placental tissue and clots were removed. A fair amount of bleeding was noted at this time. Call made for type/cross of 2units of blood Under ultrasound guidance, a suction evacuation was then performed with large placental tissue samples evacuated. After an initial brisk bleeding, bleeding improved. The suction evacuation was alternated with curettage using the banjo till the stripe was noted to be thin and no further placental products were evacuated.  Bleeding was also noted to be stable at this time.  At this point, the tenaculum was removed; site was noted to be hemostatic.  There was scant to no bleeding from cervix. The speculum was thus removed as well. Pt was cleaned and awakened. She was taken to the recovery room in stable condition.  Vitals stable however I - stat noted drop in hemoglobin from 10.7 to 6.5. Plan to administer blood in PACU Counts were correct per nursing staff during and at the end of the procedure

## 2023-12-06 NOTE — Anesthesia Procedure Notes (Signed)
 Procedure Name: Intubation Date/Time: 12/06/2023 10:15 PM  Performed by: Edelmiro Elenor NOVAK, CRNAPre-anesthesia Checklist: Patient identified, Emergency Drugs available, Suction available and Patient being monitored Patient Re-evaluated:Patient Re-evaluated prior to induction Oxygen Delivery Method: Circle System Utilized Preoxygenation: Pre-oxygenation with 100% oxygen Induction Type: IV induction, Rapid sequence and Cricoid Pressure applied Laryngoscope Size: Mac and 3 Grade View: Grade I Tube type: Oral Tube size: 7.0 mm Number of attempts: 1 Airway Equipment and Method: Stylet and Oral airway Placement Confirmation: ETT inserted through vocal cords under direct vision, positive ETCO2 and breath sounds checked- equal and bilateral Secured at: 21 cm Tube secured with: Tape Dental Injury: Teeth and Oropharynx as per pre-operative assessment

## 2023-12-06 NOTE — Anesthesia Preprocedure Evaluation (Addendum)
 Anesthesia Evaluation  Patient identified by MRN, date of birth, ID band Patient awake    Reviewed: Allergy & Precautions, NPO status , Patient's Chart, lab work & pertinent test results  Airway Mallampati: II  TM Distance: >3 FB Neck ROM: Full    Dental no notable dental hx. (+) Teeth Intact, Dental Advisory Given   Pulmonary Current Smoker and Patient abstained from smoking.   Pulmonary exam normal breath sounds clear to auscultation       Cardiovascular negative cardio ROS Normal cardiovascular exam Rhythm:Regular Rate:Normal     Neuro/Psych negative neurological ROS  negative psych ROS   GI/Hepatic negative GI ROS, Neg liver ROS,,,  Endo/Other  negative endocrine ROS    Renal/GU negative Renal ROS     Musculoskeletal   Abdominal  (+) + obese  Peds  Hematology Lab Results      Component                Value               Date                      WBC                      20.0 (H)            12/06/2023                HGB                      10.1 (L)            12/06/2023                HCT                      30.5 (L)            12/06/2023                MCV                      94.1                12/06/2023                PLT                      259                 12/06/2023              Anesthesia Other Findings   Reproductive/Obstetrics (+) Pregnancy                              Anesthesia Physical Anesthesia Plan  ASA: 3 and emergent  Anesthesia Plan: General   Post-op Pain Management: Ofirmev  IV (intra-op)* and Toradol  IV (intra-op)*   Induction: Cricoid pressure planned, Rapid sequence and Intravenous  PONV Risk Score and Plan: 4 or greater and Treatment may vary due to age or medical condition, Midazolam , Dexamethasone  and Ondansetron   Airway Management Planned: Oral ETT  Additional Equipment: None  Intra-op Plan:   Post-operative Plan: Extubation in  OR  Informed Consent: I have reviewed the patients History and Physical, chart, labs and discussed the procedure including the risks, benefits  and alternatives for the proposed anesthesia with the patient or authorized representative who has indicated his/her understanding and acceptance.     Dental advisory given  Plan Discussed with: CRNA and Surgeon  Anesthesia Plan Comments:          Anesthesia Quick Evaluation

## 2023-12-06 NOTE — H&P (Signed)
 Cassandra Galloway is a 33 y.o. prime female presenting via EMS after preterm premature vaginal delivery at home. Pt was noted to have preterm premature rupture of membranes on 12/02/2023. Given presence of FHTs pt was monitored outpt with FHTs reconfirmed via US  on 12/04/2023. She reports cramping that begun about 3 hrs ago. She then got on toilet thinking needed to have a BM and subsequently delivered baby. She reports noting fetal movements at time of delivery but stopped shortly after. EMS arrived and monitored her. They cut and clamped cord and were about to attempt delivery of placenta when family called me. I advised to leave placenta in place and bring her in to hospital instead.   OB History     Gravida  1   Para      Term      Preterm      AB      Living         SAB      IAB      Ectopic      Multiple      Live Births             Past Medical History:  Diagnosis Date   Anemia    Past Surgical History:  Procedure Laterality Date   ANTERIOR CRUCIATE LIGAMENT REPAIR Left 02/11/2022   Procedure: LEFT KNEE ACL RECONSTRUCTION WITH HAMSTRING AUTOGRAFT, MEDIAL MENISCAL REPAIR;  Surgeon: Addie Cordella Hamilton, MD;  Location: [facility] OR;  Service: Orthopedics;  Laterality: Left;   BONE MARROW BIOPSY  02/11/2022   Procedure: BONE MARROW ASPIRATE;  Surgeon: Addie Cordella Hamilton, MD;  Location: [facility] OR;  Service: Orthopedics;;   Family History: family history is not on file. Social History:  reports that she has been smoking. She has never used smokeless tobacco. She reports current alcohol use. She reports that she does not currently use drugs after having used the following drugs: Marijuana.     Maternal Diabetes: No Genetic Screening: Declined Maternal Ultrasounds/Referrals: Other: oligohydramnios Fetal Ultrasounds or other Referrals:  None Maternal Substance Abuse:  No Significant Maternal Medications:  None Significant Maternal Lab Results:  Other:  n/a Number of Prenatal Visits:greater than 3 verified prenatal visits Maternal Vaccinations:none Other Comments:  None  Review of Systems  Constitutional:  Positive for activity change and fatigue. Negative for chills, diaphoresis and fever.  Eyes:  Negative for visual disturbance.  Respiratory:  Negative for chest tightness and shortness of breath.   Cardiovascular:  Negative for chest pain, palpitations and leg swelling.  Gastrointestinal:  Positive for abdominal pain. Negative for nausea and vomiting.  Genitourinary:  Positive for pelvic pain. Negative for vaginal bleeding.  Musculoskeletal:  Negative for back pain.  Neurological:  Negative for light-headedness and headaches.  Psychiatric/Behavioral:  The patient is nervous/anxious.    Maternal Medical History:  Reason for admission: Rupture of membranes.  Nausea. Preterm premature delivery  Contractions: Frequency: irregular.   Perceived severity is moderate.   Prenatal complications: Oligohydramnios and preterm labor.   Prenatal Complications - Diabetes: none.     Blood pressure (!) 101/47, pulse (!) 102, temperature 99.5 F (37.5 C), temperature source Oral, resp. rate 20, last menstrual period 07/05/2023, SpO2 99%. Maternal Exam:  Uterine Assessment: Contraction strength is moderate.  Contraction frequency is irregular.  Abdomen: Patient reports generalized tenderness.  Introitus: Normal vulva. Normal vagina.  Amniotic fluid character: foul-smelling. Cervix: Cervix evaluated by digital exam.     Physical Exam Vitals and nursing note reviewed. Exam  conducted with a chaperone present.  Constitutional:      Appearance: Normal appearance. She is normal weight.  Cardiovascular:     Rate and Rhythm: Normal rate.     Pulses: Normal pulses.  Pulmonary:     Effort: Pulmonary effort is normal.  Abdominal:     Tenderness: There is generalized abdominal tenderness.  Genitourinary:    General: Normal vulva.   Musculoskeletal:        General: Normal range of motion.     Cervical back: Normal range of motion.  Skin:    General: Skin is warm and dry.     Capillary Refill: Capillary refill takes 2 to 3 seconds.  Neurological:     General: No focal deficit present.     Mental Status: She is alert and oriented to person, place, and time. Mental status is at baseline.  Psychiatric:        Mood and Affect: Mood normal.        Behavior: Behavior normal.        Thought Content: Thought content normal.        Judgment: Judgment normal.     Prenatal labs: ABO, Rh: --/--/AB POS (05/16 1809) Antibody:   Rubella:   RPR:    HBsAg:    HIV:    GBS:     Assessment/Plan: 33yo G1 with preterm premature delivery of 16week fetus at home after PPROM x 5 days  - Admit to [facility] - Placenta still in situ - not ready to deliver despite pushing. Rectal cytotec  placed to help effect contractions. Pt counseled may need delivery in OR. Will monitor over next 1-2 hrs  - Vaginal odor noted hence will check CBC, CMP and UCx and start on antibx treatment for presumptive chorioamnionitis - Pain control prn ; fentanyl  for now - IVF  - Pt declines genetic testing on fetus - Expectant mgmt   Ted ORN Khiyan Crace 12/06/2023, 7:32 PM

## 2023-12-06 NOTE — Interval H&P Note (Signed)
 History and Physical Interval Note:  Pt reports cramping improved with pain relief. Passed several clots but placenta still in utero Advised would be best to remove placenta surgically and pt agrees.  Consent signed after risks/benefits reviewed To OR when ready  Staff aware   12/06/2023 9:54 PM  Cassandra Galloway  has presented today for surgery, with the diagnosis of retained placenta products.  The various methods of treatment have been discussed with the patient and family. After consideration of risks, benefits and other options for treatment, the patient has consented to  Procedure(s): DILATION AND CURETTAGE (N/A) as a surgical intervention.  The patient's history has been reviewed, patient examined, no change in status, stable for surgery.  I have reviewed the patient's chart and labs.  Questions were answered to the patient's satisfaction.     Ted LELON Solo

## 2023-12-07 ENCOUNTER — Encounter (HOSPITAL_COMMUNITY): Payer: Self-pay | Admitting: Obstetrics and Gynecology

## 2023-12-07 DIAGNOSIS — D62 Acute posthemorrhagic anemia: Secondary | ICD-10-CM | POA: Diagnosis not present

## 2023-12-07 DIAGNOSIS — I959 Hypotension, unspecified: Secondary | ICD-10-CM | POA: Diagnosis present

## 2023-12-07 DIAGNOSIS — O9081 Anemia of the puerperium: Secondary | ICD-10-CM | POA: Diagnosis not present

## 2023-12-07 DIAGNOSIS — O99334 Smoking (tobacco) complicating childbirth: Secondary | ICD-10-CM | POA: Diagnosis present

## 2023-12-07 DIAGNOSIS — O42912 Preterm premature rupture of membranes, unspecified as to length of time between rupture and onset of labor, second trimester: Secondary | ICD-10-CM | POA: Diagnosis present

## 2023-12-07 DIAGNOSIS — O411220 Chorioamnionitis, second trimester, not applicable or unspecified: Secondary | ICD-10-CM | POA: Diagnosis present

## 2023-12-07 DIAGNOSIS — Z3A16 16 weeks gestation of pregnancy: Secondary | ICD-10-CM | POA: Diagnosis not present

## 2023-12-07 LAB — TYPE AND SCREEN
ABO/RH(D): AB POS
Antibody Screen: NEGATIVE
Unit division: 0
Unit division: 0

## 2023-12-07 LAB — CBC
HCT: 25 % — ABNORMAL LOW (ref 36.0–46.0)
Hemoglobin: 8.6 g/dL — ABNORMAL LOW (ref 12.0–15.0)
MCH: 32 pg (ref 26.0–34.0)
MCHC: 34.4 g/dL (ref 30.0–36.0)
MCV: 92.9 fL (ref 80.0–100.0)
Platelets: 189 K/uL (ref 150–400)
RBC: 2.69 MIL/uL — ABNORMAL LOW (ref 3.87–5.11)
RDW: 13.2 % (ref 11.5–15.5)
WBC: 21.3 K/uL — ABNORMAL HIGH (ref 4.0–10.5)
nRBC: 0 % (ref 0.0–0.2)

## 2023-12-07 LAB — BPAM RBC
Blood Product Expiration Date: 202508272359
Blood Product Expiration Date: 202508272359
ISSUE DATE / TIME: 202508032242
ISSUE DATE / TIME: 202508032242
Unit Type and Rh: 6200
Unit Type and Rh: 6200

## 2023-12-07 MED ORDER — ZOLPIDEM TARTRATE 5 MG PO TABS: 5.0000 mg | ORAL_TABLET | Freq: Every evening | ORAL | Status: DC | PRN

## 2023-12-07 MED ORDER — WITCH HAZEL-GLYCERIN EX PADS: 1.0000 | MEDICATED_PAD | CUTANEOUS | Status: DC | PRN

## 2023-12-07 MED ORDER — DIPHENHYDRAMINE HCL 25 MG PO CAPS
25.0000 mg | ORAL_CAPSULE | Freq: Four times a day (QID) | ORAL | Status: DC | PRN
Start: 2023-12-07 — End: 2023-12-08

## 2023-12-07 MED ORDER — COCONUT OIL OIL: 1.0000 | TOPICAL_OIL | Status: DC | PRN

## 2023-12-07 MED ORDER — PRENATAL MULTIVITAMIN CH
1.0000 | ORAL_TABLET | Freq: Every day | ORAL | Status: DC
  Administered 2023-12-07 – 2023-12-08 (×2): 1 via ORAL
  Filled 2023-12-07 (×2): qty 1

## 2023-12-07 MED ORDER — ONDANSETRON HCL 4 MG/2ML IJ SOLN
4.0000 mg | INTRAMUSCULAR | Status: DC | PRN
Start: 2023-12-07 — End: 2023-12-08
  Administered 2023-12-07 (×2): 4 mg via INTRAVENOUS
  Filled 2023-12-07 (×2): qty 2

## 2023-12-07 MED ORDER — BENZOCAINE-MENTHOL 20-0.5 % EX AERO: 1.0000 | INHALATION_SPRAY | CUTANEOUS | Status: DC | PRN

## 2023-12-07 MED ORDER — HYDROMORPHONE HCL 1 MG/ML IJ SOLN
INTRAMUSCULAR | Status: AC
  Filled 2023-12-07: qty 0.5

## 2023-12-07 MED ORDER — SIMETHICONE 80 MG PO CHEW: 80.0000 mg | CHEWABLE_TABLET | ORAL | Status: DC | PRN

## 2023-12-07 MED ORDER — SENNOSIDES-DOCUSATE SODIUM 8.6-50 MG PO TABS
2.0000 | ORAL_TABLET | Freq: Every day | ORAL | Status: DC
Start: 2023-12-08 — End: 2023-12-07

## 2023-12-07 MED ORDER — TETANUS-DIPHTH-ACELL PERTUSSIS 5-2.5-18.5 LF-MCG/0.5 IM SUSY: 0.5000 mL | PREFILLED_SYRINGE | Freq: Once | INTRAMUSCULAR | Status: DC

## 2023-12-07 MED ORDER — HYDROMORPHONE HCL 1 MG/ML IJ SOLN
0.5000 mg | INTRAMUSCULAR | Status: DC | PRN
  Administered 2023-12-07 (×2): 0.5 mg via INTRAVENOUS

## 2023-12-07 MED ORDER — DIBUCAINE (PERIANAL) 1 % EX OINT: 1.0000 | TOPICAL_OINTMENT | CUTANEOUS | Status: DC | PRN

## 2023-12-07 MED ORDER — SENNOSIDES-DOCUSATE SODIUM 8.6-50 MG PO TABS
2.0000 | ORAL_TABLET | Freq: Every day | ORAL | Status: DC
  Administered 2023-12-07 – 2023-12-08 (×2): 2 via ORAL
  Filled 2023-12-07 (×2): qty 2

## 2023-12-07 MED ORDER — FERROUS SULFATE 325 (65 FE) MG PO TABS
325.0000 mg | ORAL_TABLET | Freq: Two times a day (BID) | ORAL | Status: DC
  Administered 2023-12-07 – 2023-12-08 (×3): 325 mg via ORAL
  Filled 2023-12-07 (×3): qty 1

## 2023-12-07 MED ORDER — ONDANSETRON HCL 4 MG PO TABS: 4.0000 mg | ORAL_TABLET | ORAL | Status: DC | PRN

## 2023-12-07 NOTE — Progress Notes (Signed)
CSW received consult due to IUFD.  CSW available for support secondary to Spiritual Care Services and will await call from Chaplain before becoming involved.  CSW screening out referral at this time.  Javone Ybanez, LCSW Clinical Social Worker [facility] Cell#: [phone]  

## 2023-12-07 NOTE — Anesthesia Postprocedure Evaluation (Signed)
 Anesthesia Post Note  Patient: Cassandra Galloway  Procedure(s) Performed: DILATION AND CURETTAGE     Patient location during evaluation: PACU Anesthesia Type: General Level of consciousness: awake and alert Pain management: pain level controlled Vital Signs Assessment: post-procedure vital signs reviewed and stable Respiratory status: spontaneous breathing, nonlabored ventilation, respiratory function stable and patient connected to nasal cannula oxygen Cardiovascular status: blood pressure returned to baseline and stable Postop Assessment: no apparent nausea or vomiting Anesthetic complications: no   No notable events documented.  Last Vitals:  Vitals:   12/07/23 0315 12/07/23 0321  BP: (!) 89/48 (!) 85/53  Pulse: 71 67  Resp: 14   Temp: 36.5 C   SpO2: 100%     Last Pain:  Vitals:   12/07/23 0321  TempSrc:   PainSc: 0-No pain   Pain Goal:                   Garnette DELENA Gab

## 2023-12-07 NOTE — Progress Notes (Signed)
 Post Partum Day 1/Post-op day 1 Subjective: Cassandra Galloway feels weak and cold this morning, and felt lightheaded when she went to the bathroom. She ambulated to bathroom with assistance and voided well. She is not in any pain. She ate breakfast after being given IV zofran . Lochia minimal per patient and RN.   Objective:    12/07/2023    7:58 AM 12/07/2023    6:35 AM 12/07/2023    3:21 AM  Vitals with BMI  Systolic 95 93 85  Diastolic 43 46 53  Pulse 60 65 67     Physical Exam:  General: alert, cooperative, no distress, and fatigued appearing Lochia: appropriate Uterine Fundus: firm, appropriately tender to palpation DVT Evaluation: No evidence of DVT seen on physical exam. SCDs in place  Recent Labs    12/06/23 2303 12/07/23 0732  HGB 6.5* 8.6*  HCT 19.0* 25.0*    Assessment/Plan: Cassandra Galloway is a 33y/o G1 now P0010 PPD1 s/p PTD of 16w fetus at home after 5 days PPROM and POD1 s/p D&C of retained placenta. -PPD1/POD1: routine postpartum and post-op care -PPH/ABLA (acute blood loss anemia, clinically significant for this hospitalization): Starting Hgb 10.1, EBL approx 2500cc, ISTAT Hgb just after surgery 6.5, now s/p 2u pRBC; Hgb this AM 8.6. -hypotension: in setting of ABLA as above, however also received 3mg  dilaudid  post-operatively. Normal heart rate. Asymptomatic aside from lightheadedness with ambulation and fatigue. Continue low rate IV fluids. -s/p PTD: declines genetic testing on fetus. Appears to be coping well. Mother and sister support people in the room. -presumptive chorioamnionitis: vaginal odor and WBC 20.0 on admission. Temporarily febrile after one dose of cytotec . Received one dose of unasyn  prior to surgical delivery of the placenta. Appropriate tenderness to palpation of uterus this AM. Continue to monitor given mild hypotension.  Dispo: Anticipate discharge tomorrow.     LOS: 1 day   Rubie DELENA Husky, MD 12/07/2023, 8:01 AM

## 2023-12-08 ENCOUNTER — Other Ambulatory Visit: Payer: Self-pay

## 2023-12-08 ENCOUNTER — Encounter (HOSPITAL_COMMUNITY): Payer: Self-pay | Admitting: Obstetrics and Gynecology

## 2023-12-08 LAB — CBC WITH DIFFERENTIAL/PLATELET
Abs Immature Granulocytes: 0.1 K/uL — ABNORMAL HIGH (ref 0.00–0.07)
Basophils Absolute: 0 K/uL (ref 0.0–0.1)
Basophils Relative: 0 %
Eosinophils Absolute: 0 K/uL (ref 0.0–0.5)
Eosinophils Relative: 0 %
HCT: 21.5 % — ABNORMAL LOW (ref 36.0–46.0)
Hemoglobin: 7.3 g/dL — ABNORMAL LOW (ref 12.0–15.0)
Immature Granulocytes: 1 %
Lymphocytes Relative: 23 %
Lymphs Abs: 3.7 K/uL (ref 0.7–4.0)
MCH: 32 pg (ref 26.0–34.0)
MCHC: 34 g/dL (ref 30.0–36.0)
MCV: 94.3 fL (ref 80.0–100.0)
Monocytes Absolute: 0.6 K/uL (ref 0.1–1.0)
Monocytes Relative: 4 %
Neutro Abs: 11.4 K/uL — ABNORMAL HIGH (ref 1.7–7.7)
Neutrophils Relative %: 72 %
Platelets: 204 K/uL (ref 150–400)
RBC: 2.28 MIL/uL — ABNORMAL LOW (ref 3.87–5.11)
RDW: 13.5 % (ref 11.5–15.5)
WBC: 15.9 K/uL — ABNORMAL HIGH (ref 4.0–10.5)
nRBC: 0 % (ref 0.0–0.2)

## 2023-12-08 MED ORDER — ACETAMINOPHEN 325 MG PO TABS
650.0000 mg | ORAL_TABLET | Freq: Four times a day (QID) | ORAL | Status: AC | PRN
Start: 2023-12-08 — End: ?

## 2023-12-08 MED ORDER — EPINEPHRINE 0.3 MG/0.3ML IJ SOAJ: 0.3000 mg | Freq: Once | INTRAMUSCULAR | Status: DC | PRN

## 2023-12-08 MED ORDER — FERROUS SULFATE 325 (65 FE) MG PO TABS: 325.0000 mg | ORAL_TABLET | Freq: Every day | ORAL | Status: AC

## 2023-12-08 MED ORDER — METHYLPREDNISOLONE SODIUM SUCC 125 MG IJ SOLR: 125.0000 mg | Freq: Once | INTRAMUSCULAR | Status: DC | PRN

## 2023-12-08 MED ORDER — SODIUM CHLORIDE 0.9 % IV SOLN: INTRAVENOUS | Status: DC | PRN

## 2023-12-08 MED ORDER — DIPHENHYDRAMINE HCL 50 MG/ML IJ SOLN: 25.0000 mg | Freq: Once | INTRAMUSCULAR | Status: DC | PRN

## 2023-12-08 MED ORDER — SODIUM CHLORIDE 0.9 % IV BOLUS: 500.0000 mL | Freq: Once | INTRAVENOUS | Status: DC | PRN

## 2023-12-08 MED ORDER — SODIUM CHLORIDE 0.9 % IV SOLN
500.0000 mg | Freq: Once | INTRAVENOUS | Status: AC
  Administered 2023-12-08: 500 mg via INTRAVENOUS
  Filled 2023-12-08: qty 25

## 2023-12-08 MED ORDER — IBUPROFEN 200 MG PO TABS: 600.0000 mg | ORAL_TABLET | Freq: Four times a day (QID) | ORAL | Status: AC | PRN

## 2023-12-08 MED ORDER — FERROUS SULFATE 325 (65 FE) MG PO TABS: 325.0000 mg | ORAL_TABLET | Freq: Every day | ORAL | Status: DC

## 2023-12-08 MED ORDER — ALBUTEROL SULFATE (2.5 MG/3ML) 0.083% IN NEBU: 2.5000 mg | INHALATION_SOLUTION | Freq: Once | RESPIRATORY_TRACT | Status: DC | PRN

## 2023-12-08 NOTE — Progress Notes (Addendum)
 Initial visit with Iraida at her bedside to introduce spiritual care and offer support in the setting of the loss of her daughter Webster Gamma. Chaplain asked open ended questions to facilitate emotional expression and story telling. Lowell shared about her pregnancy and the last couple of days leading to the baby's early delivery at home on "Sunday.  Mikael was accompanied by FOB's mother and her own mother and sister. Chaplain utlized reflective listening to identify challenges as well as sources of hope and comfort. We discussed the impact of Jenaya's life and it's legacy. Chaplain provided grief education and support and engaged the family in conversation around ways to continue to support Josh and Shakea in the days ahead. Chaplain will follow up on Wednesday morning to bring additional resources for the children in their lives.  Please page as further needs arise.  Mazy Culton M. Davee Lomax, M.Div. [facility] Chaplain Pager [phone] Office [phone]      08" /04/25 1730  Spiritual Encounters  Type of Visit Initial  Care provided to: Pt and family  Referral source Nurse (RN/NT/LPN)  Reason for visit Grief/loss  OnCall Visit No  Spiritual Framework  Presenting Themes Values and beliefs;Meaning/purpose/sources of inspiration;Coping tools;Courage hope and growth;Impactful experiences and emotions  Community/Connection Family;Friend(s);Significant other;Faith community  Patient Stress Factors Loss  Family Stress Factors Loss  Goals  Self/Personal Goals Make peace with powerlessness of situaiton  Interventions  Spiritual Care Interventions Made Established relationship of care and support;Compassionate presence;Reflective listening;Bereavement/grief support;Narrative/life review;Normalization of emotions  Intervention Outcomes  Outcomes Connection to spiritual care;Awareness of support

## 2023-12-08 NOTE — Discharge Summary (Addendum)
 Postpartum Discharge Summary  Date of Service updated 12/08/23      Patient Name: Cassandra Galloway DOB: Apr 28, 1991 MRN: [id number]  Date of admission: 12/06/2023 Delivery date:This patient has no babies on file. Delivering provider: This patient has no babies on file. Date of discharge: 12/08/2023  Admitting diagnosis: Preterm premature rupture of membranes (PPROM) delivered, current hospitalization [O42.919] Postpartum hemorrhage [O72.1] Intrauterine pregnancy: [redacted]w[redacted]d     Secondary diagnosis:  Principal Problem:   Preterm premature rupture of membranes (PPROM) delivered, current hospitalization Active Problems:   Postpartum hemorrhage  Additional problems: 16 week vaginal delivery at home    Discharge diagnosis: Anemia, PPH, and 16 week infant loss, chorioamnionitis                                              Post partum procedures:blood transfusion and D&C for retained placenta Augmentation: N/A Complications: Intrauterine Inflammation or infection (Chorioamniotis) and Hemorrhage>1089mL  Hospital course: Presented via EMS on 8/3 after delivery of 16 week baby at home, placenta undelivered. Underwent suction D&C under US  guidance for retained placenta, complicated by hemorrhage of approx 2500cc. Received 2 units PRBCs. Was treated with unasyn  due to concern for chorioamnionitis. On POD1, was monitored for symptomatic hypotension. On POD2, symptomatic hypotension resolved. WBC downtrending.  Hgb 7.6, received IV iron  infusion prior to discharge home.   Magnesium Sulfate received: No BMZ received: No Rhophylac:N/A MMR:No T-DaP:no Flu: No RSV Vaccine received: No Transfusion:Yes Immunizations administered: Immunization History  Administered Date(s) Administered   HPV Quadrivalent 12/01/2012    Physical exam  Vitals:   12/07/23 1529 12/07/23 2001 12/07/23 2348 12/08/23 0641  BP: (!) 111/49 (!) 105/44 (!) 113/42 (!) 122/49  Pulse: 83 83 83 79  Resp: 18 18  18 18   Temp: 98.5 F (36.9 C) 98.3 F (36.8 C) 98.1 F (36.7 C) 98.4 F (36.9 C)  TempSrc: Oral Oral Oral Oral  SpO2: 100% 100% 100% 100%  Weight:      Height:       General: alert, cooperative, and no distress Lochia: appropriate Uterine Fundus: firm Incision: N/A DVT Evaluation: No evidence of DVT seen on physical exam. Labs: Lab Results  Component Value Date   WBC 15.9 (H) 12/08/2023   HGB 7.3 (L) 12/08/2023   HCT 21.5 (L) 12/08/2023   MCV 94.3 12/08/2023   PLT 204 12/08/2023      Latest Ref Rng & Units 12/06/2023   11:03 PM  CMP  Sodium 135 - 145 mmol/L 134   Potassium 3.5 - 5.1 mmol/L 3.6    Edinburgh Score:     No data to display            After visit meds:  Allergies as of 12/08/2023   No Known Allergies      Medication List     STOP taking these medications    metoCLOPramide  10 MG tablet Commonly known as: REGLAN    metroNIDAZOLE  500 MG tablet Commonly known as: FLAGYL    ondansetron  4 MG tablet Commonly known as: Zofran    Prenatal 27-1 MG Tabs   promethazine  25 MG tablet Commonly known as: PHENERGAN    scopolamine  1 MG/3DAYS Commonly known as: TRANSDERM-SCOP   terconazole  0.4 % vaginal cream Commonly known as: TERAZOL 7        TAKE these medications    acetaminophen  325 MG tablet Commonly known as:  TYLENOL  Take 2 tablets (650 mg total) by mouth every 6 (six) hours as needed for mild pain (pain score 1-3) or moderate pain (pain score 4-6).   albuterol  108 (90 Base) MCG/ACT inhaler Commonly known as: Proventil  HFA Inhale 2 puffs into the lungs every 6 (six) hours as needed.   ferrous sulfate  325 (65 FE) MG tablet Take 1 tablet (325 mg total) by mouth daily with breakfast. Start taking on: December 09, 2023   ibuprofen  200 MG tablet Commonly known as: ADVIL  Take 3 tablets (600 mg total) by mouth every 6 (six) hours as needed for mild pain (pain score 1-3) or cramping.   sertraline  50 MG tablet Commonly known as: ZOLOFT  Take 1  tablet (50 mg total) by mouth at bedtime.         Discharge home in stable condition Infant Feeding: n/a Infant Disposition: loss, pathology  Discharge instruction: per After Visit Summary and Postpartum booklet. Activity: Advance as tolerated. Pelvic rest for 6 weeks.  Diet: routine diet Anticipated Birth Control: Unsure Postpartum Appointment:2-3 days  Future Appointments:No future appointments. Follow up Visit:  Follow-up Information     Delana Ted Morrison, DO. Go on 12/11/2023.   Specialty: Obstetrics and Gynecology Why: For post op visit Contact information: [address] [place] [address] [phone]                     12/08/2023 Rosaline FORBES Chapel, MD

## 2023-12-08 NOTE — Progress Notes (Signed)
 Post Partum Day 2/Post-op day 2 Subjective: Cassandra Galloway feels improved today. Not lightheaded or dizzy when she ambulates. Voiding, tolerating PO.  She is not in any pain. She ate breakfast after being given IV zofran . Lochia minimal per patient and RN.   Objective:    12/08/2023    6:41 AM 12/07/2023   11:48 PM 12/07/2023    8:01 PM  Vitals with BMI  Systolic 122 113 894  Diastolic 49 42 44  Pulse 79 83 83     Physical Exam:  General: alert, cooperative, no distress, and fatigued appearing Lochia: appropriate Uterine Fundus: firm, appropriately tender to palpation DVT Evaluation: No evidence of DVT seen on physical exam. SCDs in place     Latest Ref Rng & Units 12/08/2023    7:39 AM 12/07/2023    7:32 AM 12/06/2023   11:03 PM  CBC  WBC 4.0 - 10.5 K/uL 15.9  21.3    Hemoglobin 12.0 - 15.0 g/dL 7.3  8.6  6.5   Hematocrit 36.0 - 46.0 % 21.5  25.0  19.0   Platelets 150 - 400 K/uL 204  189       Assessment/Plan: Cassandra Galloway is a 33y/o G1 now P0010 PPD2 s/p PTD of 16w fetus at home after 5 days PPROM and POD2 s/p D&C of retained placenta. -PPD2/POD2: routine postpartum and post-op care -PPH/ABLA (acute blood loss anemia, clinically significant for this hospitalization): Starting Hgb 10.1, EBL approx 2500cc, ISTAT Hgb just after surgery 6.5, now s/p 2u pRBC; Hgb this AM 7.3 (from 8.6 yesterday). Discussed with patient, will do IV iron  today and discharge on PO iron .  -hypotension, improving: asymptomatic -s/p PTD: declines genetic testing on fetus. Appears to be coping well. Mother and sister support people in the room. -presumptive chorioamnionitis: vaginal odor and WBC 20.0 on admission. Temporarily febrile after one dose of cytotec . Received one dose of unasyn  prior to surgical delivery of the placenta. Appropriate tenderness to palpation of uterus this AM. WBC downtrending this AM.   Dispo: discharge home, instructions reviewed. Has office follow up on Friday.     LOS: 2 days    Cassandra FORBES Chapel, MD 12/08/2023, 9:34 AM

## 2023-12-08 NOTE — Progress Notes (Signed)
 Follow up visit with Cassandra Galloway and her mother and sister at pt's bedside. FOB, Josh, was on the phone with Vernette at the time of our visit as well.   Cassandra Galloway is preparing for discharge today and bracing herself for the impact of leaving without her baby. We discussed ways that she can mitigate that experience and also discussed the family's plans for Cassandra Galloway's body. The family plans to pursue cremation via [employer].   Chaplain provided pediatric grief education and resources including two picture books for use with Josh's older children and Cassandra Galloway's god children.   We also discussed self care.  Please page as further needs arise.  Alan HERO. Davee Lomax, M.Div. [facility] Chaplain Pager [phone] Office [phone]

## 2023-12-08 NOTE — Plan of Care (Signed)
  Problem: Education: Goal: Knowledge of General Education information will improve Description: Including pain rating scale, medication(s)/side effects and non-pharmacologic comfort measures Outcome: Completed/Met   Problem: Health Behavior/Discharge Planning: Goal: Ability to manage health-related needs will improve Outcome: Completed/Met   Problem: Clinical Measurements: Goal: Ability to maintain clinical measurements within normal limits will improve Outcome: Completed/Met Goal: Will remain free from infection Outcome: Completed/Met Goal: Diagnostic test results will improve Outcome: Completed/Met Goal: Respiratory complications will improve Outcome: Completed/Met Goal: Cardiovascular complication will be avoided Outcome: Completed/Met   Problem: Activity: Goal: Risk for activity intolerance will decrease Outcome: Completed/Met   Problem: Nutrition: Goal: Adequate nutrition will be maintained Outcome: Completed/Met   Problem: Coping: Goal: Level of anxiety will decrease Outcome: Completed/Met   Problem: Elimination: Goal: Will not experience complications related to bowel motility Outcome: Completed/Met Goal: Will not experience complications related to urinary retention Outcome: Completed/Met   Problem: Pain Managment: Goal: General experience of comfort will improve and/or be controlled Outcome: Completed/Met   Problem: Safety: Goal: Ability to remain free from injury will improve Outcome: Completed/Met   Problem: Skin Integrity: Goal: Risk for impaired skin integrity will decrease Outcome: Completed/Met   Problem: Education: Goal: Knowledge of disease or condition will improve Outcome: Completed/Met Goal: Knowledge of the prescribed therapeutic regimen will improve Outcome: Completed/Met Goal: Individualized Educational Video(s) Outcome: Completed/Met   Problem: Clinical Measurements: Goal: Complications related to the disease process, condition or  treatment will be avoided or minimized Outcome: Completed/Met   Problem: Education: Goal: Knowledge of the prescribed therapeutic regimen will improve Outcome: Completed/Met Goal: Understanding of sexual limitations or changes related to disease process or condition will improve Outcome: Completed/Met Goal: Individualized Educational Video(s) Outcome: Completed/Met   Problem: Self-Concept: Goal: Communication of feelings regarding changes in body function or appearance will improve Outcome: Completed/Met   Problem: Skin Integrity: Goal: Demonstration of wound healing without infection will improve Outcome: Completed/Met   Problem: Education: Goal: Knowledge of condition will improve Outcome: Completed/Met Goal: Individualized Educational Video(s) Outcome: Completed/Met Goal: Individualized Newborn Educational Video(s) Outcome: Completed/Met   Problem: Activity: Goal: Will verbalize the importance of balancing activity with adequate rest periods Outcome: Completed/Met Goal: Ability to tolerate increased activity will improve Outcome: Completed/Met   Problem: Coping: Goal: Ability to identify and utilize available resources and services will improve Outcome: Completed/Met   Problem: Life Cycle: Goal: Chance of risk for complications during the postpartum period will decrease Outcome: Completed/Met   Problem: Role Relationship: Goal: Ability to demonstrate positive interaction with newborn will improve Outcome: Completed/Met   Problem: Skin Integrity: Goal: Demonstration of wound healing without infection will improve Outcome: Completed/Met

## 2023-12-09 LAB — SURGICAL PATHOLOGY

## 2023-12-17 ENCOUNTER — Other Ambulatory Visit (HOSPITAL_COMMUNITY): Payer: Self-pay

## 2023-12-17 MED ORDER — FLUCONAZOLE 150 MG PO TABS
150.0000 mg | ORAL_TABLET | ORAL | 0 refills | Status: DC
  Filled 2023-12-17: qty 2, 6d supply, fill #0

## 2024-01-08 ENCOUNTER — Other Ambulatory Visit (HOSPITAL_COMMUNITY): Payer: Self-pay

## 2024-01-08 MED ORDER — SERTRALINE HCL 25 MG PO TABS
25.0000 mg | ORAL_TABLET | Freq: Every day | ORAL | 1 refills | Status: AC
  Filled 2024-01-08: qty 30, 30d supply, fill #0
